# Patient Record
Sex: Female | Born: 1950
Health system: Southern US, Community
[De-identification: ages and names within clinical notes are randomized; demographics above are authoritative.]

## PROBLEM LIST (undated history)

## (undated) DIAGNOSIS — E7800 Pure hypercholesterolemia, unspecified: Secondary | ICD-10-CM

## (undated) HISTORY — DX: Pure hypercholesterolemia, unspecified: E78.00

## (undated) HISTORY — PX: ABDOMINAL HYSTERECTOMY: SHX81

---

## 1999-04-05 ENCOUNTER — Other Ambulatory Visit: Admission: RE | Admit: 1999-04-05 | Discharge: 1999-04-05 | Payer: Self-pay | Admitting: Obstetrics

## 1999-10-02 ENCOUNTER — Encounter: Admission: RE | Admit: 1999-10-02 | Discharge: 1999-10-02 | Payer: Self-pay | Admitting: Obstetrics

## 1999-10-02 ENCOUNTER — Encounter: Payer: Self-pay | Admitting: Obstetrics

## 2000-10-10 ENCOUNTER — Encounter: Admission: RE | Admit: 2000-10-10 | Discharge: 2000-10-10 | Payer: Self-pay | Admitting: Obstetrics

## 2000-10-10 ENCOUNTER — Encounter: Payer: Self-pay | Admitting: Obstetrics

## 2000-12-04 ENCOUNTER — Encounter: Payer: Self-pay | Admitting: Family Medicine

## 2000-12-04 ENCOUNTER — Encounter: Admission: RE | Admit: 2000-12-04 | Discharge: 2000-12-04 | Payer: Self-pay | Admitting: Family Medicine

## 2001-10-12 ENCOUNTER — Encounter: Admission: RE | Admit: 2001-10-12 | Discharge: 2001-10-12 | Payer: Self-pay | Admitting: Obstetrics

## 2001-10-12 ENCOUNTER — Encounter: Payer: Self-pay | Admitting: Obstetrics

## 2001-11-20 ENCOUNTER — Encounter: Admission: RE | Admit: 2001-11-20 | Discharge: 2001-11-20 | Payer: Self-pay | Admitting: Gastroenterology

## 2001-11-20 ENCOUNTER — Encounter: Payer: Self-pay | Admitting: Gastroenterology

## 2002-12-09 ENCOUNTER — Encounter: Payer: Self-pay | Admitting: Gastroenterology

## 2002-12-09 ENCOUNTER — Encounter: Admission: RE | Admit: 2002-12-09 | Discharge: 2002-12-09 | Payer: Self-pay | Admitting: Gastroenterology

## 2004-08-17 ENCOUNTER — Encounter: Admission: RE | Admit: 2004-08-17 | Discharge: 2004-08-17 | Payer: Self-pay | Admitting: Gastroenterology

## 2004-11-20 ENCOUNTER — Other Ambulatory Visit: Admission: RE | Admit: 2004-11-20 | Discharge: 2004-11-20 | Payer: Self-pay | Admitting: Family Medicine

## 2005-02-11 ENCOUNTER — Encounter: Admission: RE | Admit: 2005-02-11 | Discharge: 2005-02-11 | Payer: Self-pay | Admitting: Gastroenterology

## 2005-02-20 ENCOUNTER — Encounter: Admission: RE | Admit: 2005-02-20 | Discharge: 2005-02-20 | Payer: Self-pay | Admitting: Gastroenterology

## 2006-02-21 ENCOUNTER — Encounter: Admission: RE | Admit: 2006-02-21 | Discharge: 2006-02-21 | Payer: Self-pay | Admitting: Family Medicine

## 2006-05-29 ENCOUNTER — Other Ambulatory Visit: Admission: RE | Admit: 2006-05-29 | Discharge: 2006-05-29 | Payer: Self-pay | Admitting: Family Medicine

## 2006-08-06 ENCOUNTER — Encounter: Admission: RE | Admit: 2006-08-06 | Discharge: 2006-08-06 | Payer: Self-pay | Admitting: Gastroenterology

## 2007-06-12 ENCOUNTER — Encounter: Admission: RE | Admit: 2007-06-12 | Discharge: 2007-06-12 | Payer: Self-pay | Admitting: Family Medicine

## 2008-08-02 ENCOUNTER — Encounter: Admission: RE | Admit: 2008-08-02 | Discharge: 2008-08-02 | Payer: Self-pay | Admitting: Family Medicine

## 2009-05-02 ENCOUNTER — Other Ambulatory Visit: Admission: RE | Admit: 2009-05-02 | Discharge: 2009-05-02 | Payer: Self-pay | Admitting: Family Medicine

## 2010-07-15 ENCOUNTER — Encounter: Payer: Self-pay | Admitting: Gastroenterology

## 2010-07-15 ENCOUNTER — Encounter: Payer: Self-pay | Admitting: Family Medicine

## 2013-09-27 ENCOUNTER — Encounter: Payer: Self-pay | Admitting: Neurology

## 2013-09-27 ENCOUNTER — Ambulatory Visit (INDEPENDENT_AMBULATORY_CARE_PROVIDER_SITE_OTHER): Payer: Medicare PPO | Admitting: Neurology

## 2013-09-27 VITALS — BP 138/84 | HR 67 | Ht 60.24 in | Wt 113.3 lb

## 2013-09-27 DIAGNOSIS — G44099 Other trigeminal autonomic cephalgias (TAC), not intractable: Secondary | ICD-10-CM

## 2013-09-27 DIAGNOSIS — R209 Unspecified disturbances of skin sensation: Secondary | ICD-10-CM

## 2013-09-27 DIAGNOSIS — R200 Anesthesia of skin: Secondary | ICD-10-CM
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
## 2013-09-27 NOTE — Patient Instructions (Addendum)
Since the headache is so brief and infrequent, I wouldn't start any medications at this time.  Since the headaches are new and you have numbness on the left side, we will check an MRI of the brain.  I would like you to follow up in 3 months to make sure everything is stable. [facility] [place]  Main entrance  Radiology  10/12/13 10:45 am

## 2013-09-29 ENCOUNTER — Other Ambulatory Visit: Payer: Self-pay | Admitting: Family Medicine

## 2013-09-29 ENCOUNTER — Other Ambulatory Visit (HOSPITAL_COMMUNITY)
Admission: RE | Admit: 2013-09-29 | Discharge: 2013-09-29 | Disposition: A | Discharge status: Home / Self Care | Payer: Medicare PPO | Source: Ambulatory Visit | Attending: Family Medicine | Admitting: Family Medicine

## 2013-09-29 DIAGNOSIS — Z124 Encounter for screening for malignant neoplasm of cervix: Secondary | ICD-10-CM | POA: Insufficient documentation

## 2013-10-12 ENCOUNTER — Other Ambulatory Visit: Payer: Self-pay | Admitting: Neurology

## 2013-10-12 ENCOUNTER — Ambulatory Visit (HOSPITAL_COMMUNITY)
Admission: RE | Admit: 2013-10-12 | Discharge: 2013-10-12 | Disposition: A | Discharge status: Home / Self Care | Payer: Medicare PPO | Source: Ambulatory Visit | Attending: Neurology | Admitting: Neurology

## 2013-10-12 DIAGNOSIS — G44099 Other trigeminal autonomic cephalgias (TAC), not intractable: Secondary | ICD-10-CM

## 2013-10-12 DIAGNOSIS — R200 Anesthesia of skin: Secondary | ICD-10-CM

## 2013-10-12 DIAGNOSIS — R209 Unspecified disturbances of skin sensation: Secondary | ICD-10-CM | POA: Insufficient documentation

## 2013-10-12 MED ORDER — GADOBENATE DIMEGLUMINE 529 MG/ML IV SOLN: 10.0000 mL | Freq: Once | INTRAVENOUS | Status: AC | PRN

## 2013-10-18 ENCOUNTER — Telehealth: Payer: Self-pay | Admitting: *Deleted

## 2013-10-18 NOTE — Telephone Encounter (Signed)
No answer on any telephone number we have will try again this afternoon

## 2013-10-27 ENCOUNTER — Telehealth: Payer: Self-pay | Admitting: Neurology

## 2013-10-27 NOTE — Telephone Encounter (Signed)
Pt called and needs to talk to someone [phone]

## 2013-10-28 NOTE — Telephone Encounter (Signed)
Patient calling to ask if appt is in July  After looking I advised patient that yes it is in July

## 2013-10-28 NOTE — Telephone Encounter (Signed)
Pt friend returning called she said if she does not answer please leave her a message

## 2013-12-27 ENCOUNTER — Ambulatory Visit: Payer: Medicare PPO | Admitting: Neurology

## 2013-12-31 ENCOUNTER — Encounter: Payer: Self-pay | Admitting: Neurology

## 2013-12-31 ENCOUNTER — Ambulatory Visit (INDEPENDENT_AMBULATORY_CARE_PROVIDER_SITE_OTHER): Payer: Medicare PPO | Admitting: Neurology

## 2013-12-31 VITALS — BP 122/64 | HR 68 | Temp 98.0°F | Resp 18 | Ht 61.0 in | Wt 113.3 lb

## 2013-12-31 DIAGNOSIS — G4485 Primary stabbing headache: Secondary | ICD-10-CM

## 2013-12-31 NOTE — Patient Instructions (Signed)
MRI of the brain looked okay.  Since you rarely have these brief headaches, I wouldn't start any medication.  Follow up in 6 months to see how you are doing.  Call with any questions or concerns and I can see you sooner if needed.
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
## 2014-04-05 ENCOUNTER — Other Ambulatory Visit: Payer: Self-pay | Admitting: Family Medicine

## 2014-04-05 DIAGNOSIS — R29898 Other symptoms and signs involving the musculoskeletal system: Secondary | ICD-10-CM

## 2014-04-05 DIAGNOSIS — R200 Anesthesia of skin: Secondary | ICD-10-CM

## 2014-04-11 ENCOUNTER — Emergency Department (HOSPITAL_COMMUNITY)
Admission: EM | Admit: 2014-04-11 | Discharge: 2014-04-11 | Disposition: A | Discharge status: Home / Self Care | Payer: No Typology Code available for payment source | Attending: Emergency Medicine | Admitting: Emergency Medicine

## 2014-04-11 ENCOUNTER — Ambulatory Visit
Admission: RE | Admit: 2014-04-11 | Discharge: 2014-04-11 | Disposition: A | Discharge status: Home / Self Care | Payer: Medicare PPO | Source: Ambulatory Visit | Attending: Family Medicine | Admitting: Family Medicine

## 2014-04-11 ENCOUNTER — Emergency Department (HOSPITAL_COMMUNITY): Payer: No Typology Code available for payment source

## 2014-04-11 ENCOUNTER — Encounter (HOSPITAL_COMMUNITY): Payer: Self-pay | Admitting: Emergency Medicine

## 2014-04-11 DIAGNOSIS — S3992XA Unspecified injury of lower back, initial encounter: Secondary | ICD-10-CM | POA: Insufficient documentation

## 2014-04-11 DIAGNOSIS — M545 Low back pain, unspecified: Secondary | ICD-10-CM

## 2014-04-11 DIAGNOSIS — Z79899 Other long term (current) drug therapy: Secondary | ICD-10-CM | POA: Diagnosis not present

## 2014-04-11 DIAGNOSIS — Z7951 Long term (current) use of inhaled steroids: Secondary | ICD-10-CM | POA: Insufficient documentation

## 2014-04-11 DIAGNOSIS — Y92410 Unspecified street and highway as the place of occurrence of the external cause: Secondary | ICD-10-CM | POA: Insufficient documentation

## 2014-04-11 DIAGNOSIS — S199XXA Unspecified injury of neck, initial encounter: Secondary | ICD-10-CM | POA: Insufficient documentation

## 2014-04-11 DIAGNOSIS — S4991XA Unspecified injury of right shoulder and upper arm, initial encounter: Secondary | ICD-10-CM | POA: Insufficient documentation

## 2014-04-11 DIAGNOSIS — R200 Anesthesia of skin: Secondary | ICD-10-CM

## 2014-04-11 DIAGNOSIS — R29898 Other symptoms and signs involving the musculoskeletal system: Secondary | ICD-10-CM

## 2014-04-11 DIAGNOSIS — Y9389 Activity, other specified: Secondary | ICD-10-CM | POA: Insufficient documentation

## 2014-04-11 DIAGNOSIS — E780 Pure hypercholesterolemia: Secondary | ICD-10-CM | POA: Insufficient documentation

## 2014-04-11 DIAGNOSIS — M25511 Pain in right shoulder: Secondary | ICD-10-CM

## 2014-04-11 MED ORDER — TRAMADOL HCL 50 MG PO TABS
50.0000 mg | ORAL_TABLET | Freq: Once | ORAL | Status: AC
  Administered 2014-04-11: 50 mg via ORAL
  Filled 2014-04-11: qty 1

## 2014-04-11 MED ORDER — TRAMADOL HCL 50 MG PO TABS: 50.0000 mg | ORAL_TABLET | Freq: Four times a day (QID) | ORAL | Status: AC | PRN

## 2014-04-11 MED ORDER — MELOXICAM 7.5 MG PO TABS: 7.5000 mg | ORAL_TABLET | Freq: Every day | ORAL | Status: AC
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
## 2014-04-11 NOTE — ED Notes (Signed)
Bed: WA20 Expected date:  Expected time:  Means of arrival:  Comments: EMS 

## 2014-04-12 NOTE — ED Provider Notes (Signed)
Medical screening examination/treatment/procedure(s) were performed by non-physician practitioner and as supervising physician I was immediately available for consultation/collaboration.   EKG Interpretation None        Jawuan Robb, MD 04/12/14 0105 

## 2014-07-08 ENCOUNTER — Ambulatory Visit: Payer: Medicare PPO | Admitting: Neurology

## 2014-07-14 ENCOUNTER — Ambulatory Visit (INDEPENDENT_AMBULATORY_CARE_PROVIDER_SITE_OTHER): Payer: Medicare PPO | Admitting: Neurology

## 2014-07-14 ENCOUNTER — Encounter: Payer: Self-pay | Admitting: Neurology

## 2014-07-14 VITALS — BP 126/70 | HR 68 | Temp 98.6°F | Resp 16 | Ht 61.0 in | Wt 121.7 lb

## 2014-07-14 DIAGNOSIS — G4485 Primary stabbing headache: Secondary | ICD-10-CM | POA: Diagnosis not present

## 2014-07-14 NOTE — Patient Instructions (Signed)
Overall, you are doing well.  Therefore, I think you can follow up in one year.  Please call with any questions or concerns.
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
## 2014-08-09 DIAGNOSIS — M5412 Radiculopathy, cervical region: Secondary | ICD-10-CM | POA: Diagnosis not present

## 2014-10-04 DIAGNOSIS — H43393 Other vitreous opacities, bilateral: Secondary | ICD-10-CM | POA: Diagnosis not present

## 2014-10-18 DIAGNOSIS — H16223 Keratoconjunctivitis sicca, not specified as Sjogren's, bilateral: Secondary | ICD-10-CM | POA: Diagnosis not present

## 2014-12-27 DIAGNOSIS — H578 Other specified disorders of eye and adnexa: Secondary | ICD-10-CM | POA: Diagnosis not present

## 2015-01-04 DIAGNOSIS — H578 Other specified disorders of eye and adnexa: Secondary | ICD-10-CM | POA: Diagnosis not present

## 2015-01-19 DIAGNOSIS — H40033 Anatomical narrow angle, bilateral: Secondary | ICD-10-CM | POA: Diagnosis not present

## 2015-01-19 DIAGNOSIS — H2513 Age-related nuclear cataract, bilateral: Secondary | ICD-10-CM | POA: Diagnosis not present

## 2015-03-14 DIAGNOSIS — M545 Low back pain: Secondary | ICD-10-CM | POA: Diagnosis not present

## 2015-03-20 DIAGNOSIS — M4696 Unspecified inflammatory spondylopathy, lumbar region: Secondary | ICD-10-CM | POA: Diagnosis not present

## 2015-03-20 DIAGNOSIS — M545 Low back pain: Secondary | ICD-10-CM | POA: Diagnosis not present

## 2015-03-22 DIAGNOSIS — M47816 Spondylosis without myelopathy or radiculopathy, lumbar region: Secondary | ICD-10-CM | POA: Diagnosis not present

## 2015-03-30 DIAGNOSIS — M47816 Spondylosis without myelopathy or radiculopathy, lumbar region: Secondary | ICD-10-CM | POA: Diagnosis not present

## 2015-04-05 DIAGNOSIS — Z7189 Other specified counseling: Secondary | ICD-10-CM | POA: Diagnosis not present

## 2015-04-05 DIAGNOSIS — M549 Dorsalgia, unspecified: Secondary | ICD-10-CM | POA: Diagnosis not present

## 2015-04-05 DIAGNOSIS — Z23 Encounter for immunization: Secondary | ICD-10-CM | POA: Diagnosis not present

## 2015-04-05 DIAGNOSIS — E785 Hyperlipidemia, unspecified: Secondary | ICD-10-CM | POA: Diagnosis not present

## 2015-04-05 DIAGNOSIS — F329 Major depressive disorder, single episode, unspecified: Secondary | ICD-10-CM | POA: Diagnosis not present

## 2015-04-07 DIAGNOSIS — H04123 Dry eye syndrome of bilateral lacrimal glands: Secondary | ICD-10-CM | POA: Diagnosis not present

## 2015-04-19 DIAGNOSIS — M5416 Radiculopathy, lumbar region: Secondary | ICD-10-CM | POA: Diagnosis not present

## 2015-05-23 DIAGNOSIS — M5416 Radiculopathy, lumbar region: Secondary | ICD-10-CM | POA: Diagnosis not present

## 2015-06-02 DIAGNOSIS — M5416 Radiculopathy, lumbar region: Secondary | ICD-10-CM | POA: Diagnosis not present

## 2015-06-05 DIAGNOSIS — H578 Other specified disorders of eye and adnexa: Secondary | ICD-10-CM | POA: Diagnosis not present

## 2015-06-05 DIAGNOSIS — S0500XA Injury of conjunctiva and corneal abrasion without foreign body, unspecified eye, initial encounter: Secondary | ICD-10-CM | POA: Diagnosis not present

## 2015-06-06 DIAGNOSIS — S0500XA Injury of conjunctiva and corneal abrasion without foreign body, unspecified eye, initial encounter: Secondary | ICD-10-CM | POA: Diagnosis not present

## 2015-06-06 DIAGNOSIS — H578 Other specified disorders of eye and adnexa: Secondary | ICD-10-CM | POA: Diagnosis not present

## 2015-06-08 DIAGNOSIS — H04123 Dry eye syndrome of bilateral lacrimal glands: Secondary | ICD-10-CM | POA: Diagnosis not present

## 2015-09-07 DIAGNOSIS — H04123 Dry eye syndrome of bilateral lacrimal glands: Secondary | ICD-10-CM | POA: Diagnosis not present

## 2015-09-13 DIAGNOSIS — M5416 Radiculopathy, lumbar region: Secondary | ICD-10-CM | POA: Diagnosis not present

## 2015-09-26 DIAGNOSIS — M5416 Radiculopathy, lumbar region: Secondary | ICD-10-CM | POA: Diagnosis not present

## 2015-10-16 DIAGNOSIS — Z23 Encounter for immunization: Secondary | ICD-10-CM | POA: Diagnosis not present

## 2015-10-16 DIAGNOSIS — E785 Hyperlipidemia, unspecified: Secondary | ICD-10-CM | POA: Diagnosis not present

## 2015-10-16 DIAGNOSIS — F329 Major depressive disorder, single episode, unspecified: Secondary | ICD-10-CM | POA: Diagnosis not present

## 2016-01-18 DIAGNOSIS — H40033 Anatomical narrow angle, bilateral: Secondary | ICD-10-CM | POA: Diagnosis not present

## 2016-01-18 DIAGNOSIS — H04123 Dry eye syndrome of bilateral lacrimal glands: Secondary | ICD-10-CM | POA: Diagnosis not present

## 2016-01-22 DIAGNOSIS — B001 Herpesviral vesicular dermatitis: Secondary | ICD-10-CM | POA: Diagnosis not present

## 2016-02-08 DIAGNOSIS — H578 Other specified disorders of eye and adnexa: Secondary | ICD-10-CM | POA: Diagnosis not present

## 2016-04-08 DIAGNOSIS — F3342 Major depressive disorder, recurrent, in full remission: Secondary | ICD-10-CM | POA: Diagnosis not present

## 2016-04-15 DIAGNOSIS — M5416 Radiculopathy, lumbar region: Secondary | ICD-10-CM | POA: Diagnosis not present

## 2016-04-16 DIAGNOSIS — M5416 Radiculopathy, lumbar region: Secondary | ICD-10-CM | POA: Diagnosis not present

## 2016-04-17 DIAGNOSIS — F324 Major depressive disorder, single episode, in partial remission: Secondary | ICD-10-CM | POA: Diagnosis not present

## 2016-04-17 DIAGNOSIS — E7800 Pure hypercholesterolemia, unspecified: Secondary | ICD-10-CM | POA: Diagnosis not present

## 2016-04-17 DIAGNOSIS — M549 Dorsalgia, unspecified: Secondary | ICD-10-CM | POA: Diagnosis not present

## 2016-04-17 DIAGNOSIS — Z7189 Other specified counseling: Secondary | ICD-10-CM | POA: Diagnosis not present

## 2016-04-17 DIAGNOSIS — Z23 Encounter for immunization: Secondary | ICD-10-CM | POA: Diagnosis not present

## 2016-04-19 DIAGNOSIS — H04123 Dry eye syndrome of bilateral lacrimal glands: Secondary | ICD-10-CM | POA: Diagnosis not present

## 2016-06-06 DIAGNOSIS — H10023 Other mucopurulent conjunctivitis, bilateral: Secondary | ICD-10-CM | POA: Diagnosis not present

## 2016-07-16 DIAGNOSIS — H04123 Dry eye syndrome of bilateral lacrimal glands: Secondary | ICD-10-CM | POA: Diagnosis not present

## 2016-12-23 DIAGNOSIS — H40033 Anatomical narrow angle, bilateral: Secondary | ICD-10-CM | POA: Diagnosis not present

## 2016-12-23 DIAGNOSIS — H43393 Other vitreous opacities, bilateral: Secondary | ICD-10-CM | POA: Diagnosis not present

## 2017-04-17 DIAGNOSIS — R739 Hyperglycemia, unspecified: Secondary | ICD-10-CM | POA: Diagnosis not present

## 2017-04-17 DIAGNOSIS — B181 Chronic viral hepatitis B without delta-agent: Secondary | ICD-10-CM | POA: Diagnosis not present

## 2017-04-17 DIAGNOSIS — R946 Abnormal results of thyroid function studies: Secondary | ICD-10-CM | POA: Diagnosis not present

## 2017-04-17 DIAGNOSIS — F324 Major depressive disorder, single episode, in partial remission: Secondary | ICD-10-CM | POA: Diagnosis not present

## 2017-04-17 DIAGNOSIS — Z1211 Encounter for screening for malignant neoplasm of colon: Secondary | ICD-10-CM | POA: Diagnosis not present

## 2017-04-17 DIAGNOSIS — M85852 Other specified disorders of bone density and structure, left thigh: Secondary | ICD-10-CM | POA: Diagnosis not present

## 2017-04-17 DIAGNOSIS — Z1231 Encounter for screening mammogram for malignant neoplasm of breast: Secondary | ICD-10-CM | POA: Diagnosis not present

## 2017-04-17 DIAGNOSIS — Z23 Encounter for immunization: Secondary | ICD-10-CM | POA: Diagnosis not present

## 2017-04-17 DIAGNOSIS — M549 Dorsalgia, unspecified: Secondary | ICD-10-CM | POA: Diagnosis not present

## 2017-04-17 DIAGNOSIS — E7800 Pure hypercholesterolemia, unspecified: Secondary | ICD-10-CM | POA: Diagnosis not present

## 2017-04-22 DIAGNOSIS — H04123 Dry eye syndrome of bilateral lacrimal glands: Secondary | ICD-10-CM | POA: Diagnosis not present

## 2017-05-07 DIAGNOSIS — Z86010 Personal history of colonic polyps: Secondary | ICD-10-CM | POA: Diagnosis not present

## 2017-07-03 DIAGNOSIS — Z86010 Personal history of colonic polyps: Secondary | ICD-10-CM | POA: Diagnosis not present

## 2017-07-03 DIAGNOSIS — K640 First degree hemorrhoids: Secondary | ICD-10-CM | POA: Diagnosis not present

## 2017-07-03 DIAGNOSIS — K635 Polyp of colon: Secondary | ICD-10-CM | POA: Diagnosis not present

## 2017-07-08 DIAGNOSIS — K635 Polyp of colon: Secondary | ICD-10-CM | POA: Diagnosis not present

## 2017-07-14 DIAGNOSIS — M8588 Other specified disorders of bone density and structure, other site: Secondary | ICD-10-CM | POA: Diagnosis not present

## 2017-07-18 DIAGNOSIS — H04123 Dry eye syndrome of bilateral lacrimal glands: Secondary | ICD-10-CM | POA: Diagnosis not present

## 2017-10-03 DIAGNOSIS — Z1231 Encounter for screening mammogram for malignant neoplasm of breast: Secondary | ICD-10-CM | POA: Diagnosis not present

## 2017-10-03 DIAGNOSIS — Z803 Family history of malignant neoplasm of breast: Secondary | ICD-10-CM | POA: Diagnosis not present

## 2017-11-28 DIAGNOSIS — H04123 Dry eye syndrome of bilateral lacrimal glands: Secondary | ICD-10-CM | POA: Diagnosis not present

## 2017-12-17 DIAGNOSIS — H40033 Anatomical narrow angle, bilateral: Secondary | ICD-10-CM | POA: Diagnosis not present

## 2017-12-17 DIAGNOSIS — H43393 Other vitreous opacities, bilateral: Secondary | ICD-10-CM | POA: Diagnosis not present

## 2018-01-19 DIAGNOSIS — H04123 Dry eye syndrome of bilateral lacrimal glands: Secondary | ICD-10-CM | POA: Diagnosis not present

## 2018-03-05 DIAGNOSIS — H04123 Dry eye syndrome of bilateral lacrimal glands: Secondary | ICD-10-CM | POA: Diagnosis not present

## 2018-04-22 DIAGNOSIS — H04123 Dry eye syndrome of bilateral lacrimal glands: Secondary | ICD-10-CM | POA: Diagnosis not present

## 2018-04-23 DIAGNOSIS — Z23 Encounter for immunization: Secondary | ICD-10-CM | POA: Diagnosis not present

## 2018-04-23 DIAGNOSIS — Z0000 Encounter for general adult medical examination without abnormal findings: Secondary | ICD-10-CM | POA: Diagnosis not present

## 2018-04-23 DIAGNOSIS — M859 Disorder of bone density and structure, unspecified: Secondary | ICD-10-CM | POA: Diagnosis not present

## 2018-04-23 DIAGNOSIS — F324 Major depressive disorder, single episode, in partial remission: Secondary | ICD-10-CM | POA: Diagnosis not present

## 2018-04-23 DIAGNOSIS — E7800 Pure hypercholesterolemia, unspecified: Secondary | ICD-10-CM | POA: Diagnosis not present

## 2018-04-23 DIAGNOSIS — B181 Chronic viral hepatitis B without delta-agent: Secondary | ICD-10-CM | POA: Diagnosis not present

## 2018-04-27 ENCOUNTER — Other Ambulatory Visit: Payer: Self-pay | Admitting: Family Medicine

## 2018-04-27 DIAGNOSIS — B181 Chronic viral hepatitis B without delta-agent: Secondary | ICD-10-CM

## 2018-04-28 DIAGNOSIS — H04123 Dry eye syndrome of bilateral lacrimal glands: Secondary | ICD-10-CM | POA: Diagnosis not present

## 2018-05-01 ENCOUNTER — Ambulatory Visit
Admission: RE | Admit: 2018-05-01 | Discharge: 2018-05-01 | Disposition: A | Discharge status: Home / Self Care | Payer: Medicare PPO | Source: Ambulatory Visit | Attending: Family Medicine | Admitting: Family Medicine

## 2018-05-01 DIAGNOSIS — B181 Chronic viral hepatitis B without delta-agent: Secondary | ICD-10-CM

## 2018-07-23 DIAGNOSIS — H16223 Keratoconjunctivitis sicca, not specified as Sjogren's, bilateral: Secondary | ICD-10-CM | POA: Diagnosis not present

## 2018-09-10 DIAGNOSIS — H04123 Dry eye syndrome of bilateral lacrimal glands: Secondary | ICD-10-CM | POA: Diagnosis not present

## 2018-12-17 DIAGNOSIS — H04123 Dry eye syndrome of bilateral lacrimal glands: Secondary | ICD-10-CM | POA: Diagnosis not present

## 2019-02-12 ENCOUNTER — Emergency Department (HOSPITAL_COMMUNITY): Payer: Medicare PPO

## 2019-02-12 ENCOUNTER — Emergency Department (HOSPITAL_COMMUNITY)
Admission: EM | Admit: 2019-02-12 | Discharge: 2019-02-13 | Disposition: A | Discharge status: Home / Self Care | Payer: Medicare PPO | Attending: Emergency Medicine | Admitting: Emergency Medicine

## 2019-02-12 DIAGNOSIS — S53105A Unspecified dislocation of left ulnohumeral joint, initial encounter: Secondary | ICD-10-CM | POA: Insufficient documentation

## 2019-02-12 DIAGNOSIS — R52 Pain, unspecified: Secondary | ICD-10-CM

## 2019-02-12 DIAGNOSIS — Z79899 Other long term (current) drug therapy: Secondary | ICD-10-CM | POA: Diagnosis not present

## 2019-02-12 DIAGNOSIS — M25512 Pain in left shoulder: Secondary | ICD-10-CM | POA: Diagnosis not present

## 2019-02-12 DIAGNOSIS — S53125A Posterior dislocation of left ulnohumeral joint, initial encounter: Secondary | ICD-10-CM | POA: Diagnosis not present

## 2019-02-12 DIAGNOSIS — Y999 Unspecified external cause status: Secondary | ICD-10-CM | POA: Insufficient documentation

## 2019-02-12 DIAGNOSIS — Y929 Unspecified place or not applicable: Secondary | ICD-10-CM | POA: Insufficient documentation

## 2019-02-12 DIAGNOSIS — W1830XA Fall on same level, unspecified, initial encounter: Secondary | ICD-10-CM | POA: Insufficient documentation

## 2019-02-12 DIAGNOSIS — S59902A Unspecified injury of left elbow, initial encounter: Secondary | ICD-10-CM | POA: Diagnosis present

## 2019-02-12 DIAGNOSIS — S53102A Unspecified subluxation of left ulnohumeral joint, initial encounter: Secondary | ICD-10-CM | POA: Diagnosis not present

## 2019-02-12 DIAGNOSIS — Y93K1 Activity, walking an animal: Secondary | ICD-10-CM | POA: Insufficient documentation

## 2019-02-12 MED ORDER — FENTANYL CITRATE (PF) 100 MCG/2ML IJ SOLN
25.0000 ug | Freq: Once | INTRAMUSCULAR | Status: AC
Start: 2019-02-12 — End: 2019-02-12
  Administered 2019-02-12: 25 ug via NASAL
  Filled 2019-02-12: qty 2

## 2019-02-12 MED ORDER — PROPOFOL 10 MG/ML IV BOLUS
0.5000 mg/kg | Freq: Once | INTRAVENOUS | Status: DC
  Filled 2019-02-12: qty 20

## 2019-02-12 MED ORDER — MORPHINE SULFATE (PF) 4 MG/ML IV SOLN
4.0000 mg | Freq: Once | INTRAVENOUS | Status: AC
  Administered 2019-02-13: 4 mg via INTRAVENOUS
  Filled 2019-02-12: qty 1

## 2019-02-12 MED ORDER — ONDANSETRON HCL 4 MG/2ML IJ SOLN
4.0000 mg | Freq: Once | INTRAMUSCULAR | Status: AC
  Administered 2019-02-13: 4 mg via INTRAVENOUS
  Filled 2019-02-12: qty 2

## 2019-02-12 NOTE — ED Provider Notes (Signed)
MSE was initiated and I personally evaluated the patient and placed orders (if any) at  8:46 PM on February 12, 2019.  The patient appears stable so that the remainder of the MSE may be completed by another provider.   68 year old female brought in by her neighbor for left arm injury.  Patient was pulled down by her dog on a leash and has pain in her left elbow and left shoulder area.  Patient did not hit her head, no loss of consciousness.  No neck pain.  Patient has movement sensation intact in her left hand, strong radial pulse.  Pain in the left elbow and left shoulder, no pain along clavicle or C-spine.   [name], PA-C 02/12/19 2047    [name], MD 02/13/19 [phone]

## 2019-02-12 NOTE — ED Provider Notes (Signed)
[facility]   CSN: [id number] Arrival date & time: 02/12/19  2010     History   Chief Complaint Chief Complaint  Patient presents with  . Arm Injury    HPI Kayla Duffy is a 68 y.o. female.     68 year old female presents to the emergency department for evaluation of pain to her left elbow and shoulder.  She was walking the dog when the dog saw a rabbit and pulled on the leash causing her to fall.  Pain is been constant since onset.  No medications taken prior to arrival.  She denies any numbness or tingling in her left upper extremity, neck pain, weakness.  She did not hit her head or lose consciousness.  Not on chronic anticoagulation.  Last ate/drank at 1830.  The history is provided by the patient. No language interpreter was used.  Arm Injury   Past Medical History:  Diagnosis Date  . High cholesterol     There are no active problems to display for this patient.   Past Surgical History:  Procedure Laterality Date  . ABDOMINAL HYSTERECTOMY       OB History   No obstetric history on file.      Home Medications    Prior to Admission medications   Medication Sig Start Date End Date Taking? Authorizing Provider  atorvastatin (LIPITOR) 20 MG tablet Take 20 mg by mouth daily.    [provider]  cyclobenzaprine (FLEXERIL) 10 MG tablet  07/07/14   [provider]  HYDROcodone-acetaminophen (NORCO/VICODIN) 5-325 MG tablet Take 1 tablet by mouth every 4 (four) hours as needed for severe pain. 02/13/19   Antony MaduraHumes, Jaemarie Hochberg, PA-C  ketorolac (ACULAR) 0.5 % ophthalmic solution Place 1 drop into the right eye 4 (four) times daily.    [provider]  meloxicam (MOBIC) 7.5 MG tablet Take 1 tablet (7.5 mg total) by mouth daily. 04/11/14   Lurene ShadowPhelps, Erin O, PA-C  Multiple Vitamin (MULTIVITAMIN WITH MINERALS) TABS tablet Take 2 tablets by mouth daily.    [provider]  prednisoLONE sodium  phosphate (INFLAMASE FORTE) 1 % ophthalmic solution Place 1 drop into the right eye 4 (four) times daily.    [provider]  traMADol (ULTRAM) 50 MG tablet Take 1 tablet (50 mg total) by mouth every 6 (six) hours as needed. 04/11/14   Lurene ShadowPhelps, Erin O, PA-C  venlafaxine (EFFEXOR) 75 MG tablet Take 37.5 mg by mouth 2 (two) times daily.     [provider]    Family History Family History  Problem Relation Age of Onset  . Cancer Mother        cervical    Social History Social History   Tobacco Use  . Smoking status: Never Smoker  . Smokeless tobacco: Never Used  Substance Use Topics  . Alcohol use: No  . Drug use: Not on file     Allergies   Patient has no known allergies.   Review of Systems Review of Systems Ten systems reviewed and are negative for acute change, except as noted in the HPI.    Physical Exam Updated Vital Signs BP (!) 155/99 (BP Location: Right Arm)   Pulse 90   Temp 97.8 F (36.6 C) (Oral)   Resp 18   SpO2 98%   Physical Exam Vitals signs and nursing note reviewed.  Constitutional:      General: She is not in acute distress.    Appearance: She is  well-developed. She is not diaphoretic.     Comments: Nontoxic appearing.  HENT:     Head: Normocephalic and atraumatic.  Eyes:     General: No scleral icterus.    Conjunctiva/sclera: Conjunctivae normal.  Neck:     Musculoskeletal: Normal range of motion.  Cardiovascular:     Rate and Rhythm: Normal rate and regular rhythm.     Pulses: Normal pulses.     Comments: Distal radial pulse 2+ in the LUE Pulmonary:     Effort: Pulmonary effort is normal. No respiratory distress.     Comments: Respirations even and unlabored Musculoskeletal:     Comments: No deformity or crepitus to the L shoulder; preserved ROM. Left elbow in splint.  Skin:    General: Skin is warm and dry.     Coloration: Skin is not pale.     Findings: No erythema or rash.  Neurological:     Mental Status: She  is alert and oriented to person, place, and time.     Comments: Sensation to light touch intact in the LUE. Grip strength 5/5 in the left hand.  Psychiatric:        Behavior: Behavior normal.      ED Treatments / Results  Labs (all labs ordered are listed, but only abnormal results are displayed) Labs Reviewed - No data to display  EKG None  Radiology Dg Elbow 2 Views Left  Result Date: 02/13/2019 CLINICAL DATA:  Dislocation, postreduction. EXAM: LEFT ELBOW - 2 VIEW COMPARISON:  Pre reduction radiographs yesterday. FINDINGS: Previous elbow dislocation has been reduced. The alignment appears anatomic. Small fracture fragment adjacent to the medial humeral epicondyle, donor site uncertain. Small joint effusion. Diffuse soft tissue edema. IMPRESSION: Reduction of prior elbow dislocation. Small fracture fragment adjacent to the medial humeral epicondyle, donor site uncertain. Electronically Signed   By: Keith Rake M.D.   On: 02/13/2019 01:25   Dg Elbow Complete Left  Result Date: 02/12/2019 CLINICAL DATA:  Fall, pain EXAM: LEFT ELBOW - COMPLETE 3+ VIEW COMPARISON:  None. FINDINGS: There is posterior dislocation of the left elbow. No evident fracture. Soft tissue edema. IMPRESSION: There is posterior dislocation of the left elbow. No evident fracture. Soft tissue edema. Electronically Signed   By: Eddie Candle M.D.   On: 02/12/2019 21:48   Dg Shoulder Left  Result Date: 02/12/2019 CLINICAL DATA:  Pulled over by dog on leash.  Left shoulder pain. EXAM: LEFT SHOULDER - 2+ VIEW COMPARISON:  None. FINDINGS: There is no evidence of fracture or dislocation. There is no evidence of arthropathy or other focal bone abnormality. Soft tissues are unremarkable. IMPRESSION: Negative. Electronically Signed   By: Misty Stanley M.D.   On: 02/12/2019 21:45    Procedures Reduction of dislocation  Date/Time: 02/13/2019 1:08 AM Performed by: Antonietta Breach, PA-C Authorized by: Merrily Pew, MD   Consent: The procedure was performed in an emergent situation. Verbal consent obtained. Written consent obtained. Risks and benefits: risks, benefits and alternatives were discussed Consent given by: patient Patient understanding: patient states understanding of the procedure being performed Patient consent: the patient's understanding of the procedure matches consent given Procedure consent: procedure consent matches procedure scheduled Relevant documents: relevant documents present and verified Test results: test results available and properly labeled Site marked: the operative site was marked Imaging studies: imaging studies available Required items: required blood products, implants, devices, and special equipment available Patient identity confirmed: verbally with patient and arm band Time out: Immediately prior to procedure a "  time out" was called to verify the correct patient, procedure, equipment, support staff and site/side marked as required. Preparation: Patient was prepped and draped in the usual sterile fashion. Local anesthesia used: no  Anesthesia: Local anesthesia used: no  Sedation: Patient sedated: yes Sedatives: propofol (see separate sedation procedure note) Vitals: Vital signs were monitored during sedation.  Patient tolerance: patient tolerated the procedure well with no immediate complications Comments: Successful reduction of left elbow dislocation.  Posterior long-arm splint applied following reduction.  Patient tolerated well without complications.    (including critical care time)  Medications Ordered in ED Medications  propofol (DIPRIVAN) 10 mg/mL bolus/IV push 27.5-55 mg (has no administration in time range)  ketorolac (TORADOL) 30 MG/ML injection 15 mg (has no administration in time range)  fentaNYL (SUBLIMAZE) injection 25 mcg (25 mcg Nasal Given 02/12/19 2046)  morphine 4 MG/ML injection 4 mg (4 mg Intravenous Given 02/13/19 0001)  ondansetron  (ZOFRAN) injection 4 mg (4 mg Intravenous Given 02/13/19 0001)  0.9 %  sodium chloride infusion ( Intravenous Stopped 02/13/19 0156)  propofol (DIPRIVAN) 10 mg/mL bolus/IV push (30 mg Intravenous Given 02/13/19 0046)  HYDROcodone-acetaminophen (NORCO/VICODIN) 5-325 MG per tablet 1 tablet (1 tablet Oral Given 02/13/19 0150)    1:45 AM Patient reassessed. Good distal radial pulse post splinting.   Initial Impression / Assessment and Plan / ED Course  I have reviewed the triage vital signs and the nursing notes.  Pertinent labs & imaging results that were available during my care of the patient were reviewed by me and considered in my medical decision making (see chart for details).        68 year old female presents to the emergency department for evaluation of pain to her left upper extremity after her arm was pulled while walking her dog.  Noted to have an left elbow dislocation.  This was reduced successfully under sedation with propofol.  Patient tolerated well without complications.  Neurovascularly intact pre-and post reduction of dislocation.  Pain controlled with morphine as well as 1 tablet of oral Vicodin.  Placed in a posterior long-arm splint and will be given referral to orthopedics to ensure proper healing.  Return precautions discussed and provided. Patient discharged in stable condition with no unaddressed concerns.   Final Clinical Impressions(s) / ED Diagnoses   Final diagnoses:  Elbow dislocation, left, initial encounter    ED Discharge Orders         Ordered    HYDROcodone-acetaminophen (NORCO/VICODIN) 5-325 MG tablet  Every 4 hours PRN     02/13/19 0139           Antony MaduraHumes, Kalecia Hartney, Cordelia Poche-C 02/13/19 0202    Mesner, Barbara CowerJason, MD 02/13/19 [phone]

## 2019-02-12 NOTE — ED Notes (Signed)
Patient transported to X-ray 

## 2019-02-12 NOTE — ED Notes (Signed)
Left arm immobilized with brace

## 2019-02-12 NOTE — ED Triage Notes (Signed)
Pt here with neighbor. Per neighbor, pt fell after being pulled down by dog on a leash. Pt fell on left arm. C/o pain at elbow. Pt is alert and oriented. Denies LOC.

## 2019-02-13 ENCOUNTER — Emergency Department (HOSPITAL_COMMUNITY): Payer: Medicare PPO

## 2019-02-13 DIAGNOSIS — S53105A Unspecified dislocation of left ulnohumeral joint, initial encounter: Secondary | ICD-10-CM | POA: Diagnosis not present

## 2019-02-13 MED ORDER — PROPOFOL 10 MG/ML IV BOLUS
INTRAVENOUS | Status: AC | PRN
  Administered 2019-02-13: 30 mg via INTRAVENOUS

## 2019-02-13 MED ORDER — HYDROCODONE-ACETAMINOPHEN 5-325 MG PO TABS: 1.0000 | ORAL_TABLET | ORAL | 0 refills | Status: AC | PRN

## 2019-02-13 MED ORDER — HYDROCODONE-ACETAMINOPHEN 5-325 MG PO TABS
1.0000 | ORAL_TABLET | Freq: Once | ORAL | Status: AC
  Administered 2019-02-13: 02:00:00 1 via ORAL
  Filled 2019-02-13: qty 1

## 2019-02-13 MED ORDER — SODIUM CHLORIDE 0.9 % IV SOLN
INTRAVENOUS | Status: AC | PRN
  Administered 2019-02-13: 1000 mL via INTRAVENOUS

## 2019-02-13 MED ORDER — KETOROLAC TROMETHAMINE 30 MG/ML IJ SOLN: 15.0000 mg | Freq: Once | INTRAMUSCULAR | Status: DC

## 2019-02-13 NOTE — Discharge Instructions (Signed)
Call the office of Dr. Griffin Basil in the morning to schedule close outpatient follow-up to ensure proper healing of your elbow dislocation.  Keep your splint on at all times.  Do not remove it.  Keep it dry and clean as best as you are able.  We recommend use of ibuprofen or Aleve for pain.  You may also take Norco as prescribed for severe pain.  Ice over top of your splint 3-4 times per day to assist in improving inflammation.  Keep your arm elevated to prevent swelling in your fingers.  Return to the emergency department for any new or concerning symptoms - especially if your hand becomes numb or cold, persistently pale.

## 2019-02-13 NOTE — ED Provider Notes (Signed)
Medical screening examination/treatment/procedure(s) were conducted as a shared visit with non-physician practitioner(s) and myself.  I personally evaluated the patient during the encounter.  Physical Exam  BP (!) 165/102 (BP Location: Right Arm)   Pulse 92   Temp 98.4 F (36.9 C) (Oral)   Resp (!) 22   SpO2 96%   Physical Exam Vitals signs and nursing note reviewed.  Constitutional:      Appearance: Normal appearance.  HENT:     Mouth/Throat:     Mouth: Mucous membranes are moist.  Eyes:     Pupils: Pupils are equal, round, and reactive to light.  Neck:     Musculoskeletal: Normal range of motion. No neck rigidity or muscular tenderness.  Cardiovascular:     Rate and Rhythm: Normal rate.  Pulmonary:     Breath sounds: No wheezing.  Abdominal:     General: There is no distension.     Tenderness: There is no abdominal tenderness.  Musculoskeletal:     Comments: Left arm with ecchymosis in elbow, deformity, tenderness in same.   Lymphadenopathy:     Cervical: No cervical adenopathy.  Neurological:     Mental Status: She is alert.     ED Course/Procedures     .Sedation  Date/Time: 02/13/2019 12:51 AM Performed by: Merrily Pew, MD Authorized by: Merrily Pew, MD   Consent:    Consent obtained:  Verbal   Consent given by:  Patient   Risks discussed:  Allergic reaction, dysrhythmia, inadequate sedation, nausea, prolonged hypoxia resulting in organ damage, prolonged sedation necessitating reversal, respiratory compromise necessitating ventilatory assistance and intubation and vomiting   Alternatives discussed:  Analgesia without sedation, anxiolysis and regional anesthesia Universal protocol:    Procedure explained and questions answered to patient or proxy's satisfaction: yes     Relevant documents present and verified: yes     Test results available and properly labeled: yes     Imaging studies available: yes     Required blood products, implants, devices, and  special equipment available: yes     Site/side marked: yes     Immediately prior to procedure a time out was called: yes     Patient identity confirmation method:  Verbally with patient Indications:    Procedure necessitating sedation performed by:  Physician performing sedation Pre-sedation assessment:    Time since last food or drink:  Few hours   ASA classification: class 1 - normal, healthy patient     Neck mobility: normal     Mouth opening:  3 or more finger widths   Thyromental distance:  4 finger widths   Mallampati score:  I - soft palate, uvula, fauces, pillars visible   Pre-sedation assessments completed and reviewed: airway patency, cardiovascular function, hydration status, mental status, nausea/vomiting, pain level, respiratory function and temperature     Pre-sedation assessment completed:  02/13/2019 12:42 AM Immediate pre-procedure details:    Reassessment: Patient reassessed immediately prior to procedure     Reviewed: vital signs, relevant labs/tests and NPO status     Verified: bag valve mask available, emergency equipment available, intubation equipment available, IV patency confirmed, oxygen available and suction available   Procedure details (see MAR for exact dosages):    Preoxygenation:  Nasal cannula   Sedation:  Propofol   Intra-procedure monitoring:  Blood pressure monitoring, cardiac monitor, continuous pulse oximetry, frequent LOC assessments, frequent vital sign checks and continuous capnometry   Intra-procedure events: none     Total Provider sedation time (minutes):  16  Post-procedure details:    Attendance: Constant attendance by certified staff until patient recovered     Recovery: Patient returned to pre-procedure baseline     Post-sedation assessments completed and reviewed: airway patency, cardiovascular function, hydration status, mental status, nausea/vomiting, pain level, respiratory function and temperature     Patient is stable for discharge or  admission: yes     Patient tolerance:  Tolerated well, no immediate complications    MDM   Patient walking dog and it took off pulling her left arm straight out with it. Has pain in left elbow since that time. No other injuries.   Sedation by myself, reduction by PA Humes.   Reduction films at bedside appear to be reduced. Plan for splint, post reduction films, ortho follow up.         Marily MemosMesner, Yusuke Beza, MD 02/13/19 [phone]

## 2019-02-16 DIAGNOSIS — S53105A Unspecified dislocation of left ulnohumeral joint, initial encounter: Secondary | ICD-10-CM | POA: Diagnosis not present

## 2019-02-26 DIAGNOSIS — S53105D Unspecified dislocation of left ulnohumeral joint, subsequent encounter: Secondary | ICD-10-CM | POA: Diagnosis not present

## 2019-03-04 DIAGNOSIS — M25522 Pain in left elbow: Secondary | ICD-10-CM | POA: Diagnosis not present

## 2019-03-04 DIAGNOSIS — M25622 Stiffness of left elbow, not elsewhere classified: Secondary | ICD-10-CM | POA: Diagnosis not present

## 2019-03-04 DIAGNOSIS — S53105D Unspecified dislocation of left ulnohumeral joint, subsequent encounter: Secondary | ICD-10-CM | POA: Diagnosis not present

## 2019-03-04 DIAGNOSIS — M6281 Muscle weakness (generalized): Secondary | ICD-10-CM | POA: Diagnosis not present

## 2019-03-09 DIAGNOSIS — M25622 Stiffness of left elbow, not elsewhere classified: Secondary | ICD-10-CM | POA: Diagnosis not present

## 2019-03-09 DIAGNOSIS — S53105D Unspecified dislocation of left ulnohumeral joint, subsequent encounter: Secondary | ICD-10-CM | POA: Diagnosis not present

## 2019-03-09 DIAGNOSIS — M25522 Pain in left elbow: Secondary | ICD-10-CM | POA: Diagnosis not present

## 2019-03-09 DIAGNOSIS — M6281 Muscle weakness (generalized): Secondary | ICD-10-CM | POA: Diagnosis not present

## 2019-03-11 DIAGNOSIS — S53105D Unspecified dislocation of left ulnohumeral joint, subsequent encounter: Secondary | ICD-10-CM | POA: Diagnosis not present

## 2019-03-11 DIAGNOSIS — M25522 Pain in left elbow: Secondary | ICD-10-CM | POA: Diagnosis not present

## 2019-03-11 DIAGNOSIS — M6281 Muscle weakness (generalized): Secondary | ICD-10-CM | POA: Diagnosis not present

## 2019-03-11 DIAGNOSIS — M25622 Stiffness of left elbow, not elsewhere classified: Secondary | ICD-10-CM | POA: Diagnosis not present

## 2019-03-16 DIAGNOSIS — M25522 Pain in left elbow: Secondary | ICD-10-CM | POA: Diagnosis not present

## 2019-03-16 DIAGNOSIS — M6281 Muscle weakness (generalized): Secondary | ICD-10-CM | POA: Diagnosis not present

## 2019-03-16 DIAGNOSIS — S53105D Unspecified dislocation of left ulnohumeral joint, subsequent encounter: Secondary | ICD-10-CM | POA: Diagnosis not present

## 2019-03-16 DIAGNOSIS — M25622 Stiffness of left elbow, not elsewhere classified: Secondary | ICD-10-CM | POA: Diagnosis not present

## 2019-03-18 DIAGNOSIS — S53105A Unspecified dislocation of left ulnohumeral joint, initial encounter: Secondary | ICD-10-CM | POA: Diagnosis not present

## 2019-05-06 DIAGNOSIS — Z0000 Encounter for general adult medical examination without abnormal findings: Secondary | ICD-10-CM | POA: Diagnosis not present

## 2019-05-06 DIAGNOSIS — M85852 Other specified disorders of bone density and structure, left thigh: Secondary | ICD-10-CM | POA: Diagnosis not present

## 2019-05-06 DIAGNOSIS — Z1231 Encounter for screening mammogram for malignant neoplasm of breast: Secondary | ICD-10-CM | POA: Diagnosis not present

## 2019-05-06 DIAGNOSIS — B181 Chronic viral hepatitis B without delta-agent: Secondary | ICD-10-CM | POA: Diagnosis not present

## 2019-05-06 DIAGNOSIS — Z23 Encounter for immunization: Secondary | ICD-10-CM | POA: Diagnosis not present

## 2019-05-06 DIAGNOSIS — E7800 Pure hypercholesterolemia, unspecified: Secondary | ICD-10-CM | POA: Diagnosis not present

## 2019-05-06 DIAGNOSIS — F324 Major depressive disorder, single episode, in partial remission: Secondary | ICD-10-CM | POA: Diagnosis not present

## 2019-05-25 ENCOUNTER — Other Ambulatory Visit: Payer: Self-pay | Admitting: Family Medicine

## 2019-05-25 DIAGNOSIS — B181 Chronic viral hepatitis B without delta-agent: Secondary | ICD-10-CM

## 2019-06-01 DIAGNOSIS — M8589 Other specified disorders of bone density and structure, multiple sites: Secondary | ICD-10-CM | POA: Diagnosis not present

## 2019-06-01 DIAGNOSIS — R29890 Loss of height: Secondary | ICD-10-CM | POA: Diagnosis not present

## 2019-06-01 DIAGNOSIS — Z1231 Encounter for screening mammogram for malignant neoplasm of breast: Secondary | ICD-10-CM | POA: Diagnosis not present

## 2019-06-01 DIAGNOSIS — N951 Menopausal and female climacteric states: Secondary | ICD-10-CM | POA: Diagnosis not present

## 2019-06-02 ENCOUNTER — Ambulatory Visit
Admission: RE | Admit: 2019-06-02 | Discharge: 2019-06-02 | Disposition: A | Discharge status: Home / Self Care | Payer: Medicare PPO | Source: Ambulatory Visit | Attending: Family Medicine | Admitting: Family Medicine

## 2019-06-02 DIAGNOSIS — B181 Chronic viral hepatitis B without delta-agent: Secondary | ICD-10-CM | POA: Diagnosis not present

## 2019-07-26 ENCOUNTER — Other Ambulatory Visit: Payer: Medicare PPO

## 2019-10-19 DIAGNOSIS — H04123 Dry eye syndrome of bilateral lacrimal glands: Secondary | ICD-10-CM | POA: Diagnosis not present

## 2020-04-21 DIAGNOSIS — R109 Unspecified abdominal pain: Secondary | ICD-10-CM | POA: Diagnosis not present

## 2020-04-21 DIAGNOSIS — Z23 Encounter for immunization: Secondary | ICD-10-CM | POA: Diagnosis not present

## 2020-05-08 DIAGNOSIS — Z131 Encounter for screening for diabetes mellitus: Secondary | ICD-10-CM | POA: Diagnosis not present

## 2020-05-08 DIAGNOSIS — Z23 Encounter for immunization: Secondary | ICD-10-CM | POA: Diagnosis not present

## 2020-05-08 DIAGNOSIS — F324 Major depressive disorder, single episode, in partial remission: Secondary | ICD-10-CM | POA: Diagnosis not present

## 2020-05-08 DIAGNOSIS — M549 Dorsalgia, unspecified: Secondary | ICD-10-CM | POA: Diagnosis not present

## 2020-05-08 DIAGNOSIS — Z0000 Encounter for general adult medical examination without abnormal findings: Secondary | ICD-10-CM | POA: Diagnosis not present

## 2020-05-08 DIAGNOSIS — E7800 Pure hypercholesterolemia, unspecified: Secondary | ICD-10-CM | POA: Diagnosis not present

## 2020-05-08 DIAGNOSIS — B181 Chronic viral hepatitis B without delta-agent: Secondary | ICD-10-CM | POA: Diagnosis not present

## 2020-05-08 DIAGNOSIS — R739 Hyperglycemia, unspecified: Secondary | ICD-10-CM | POA: Diagnosis not present

## 2020-05-09 ENCOUNTER — Other Ambulatory Visit: Payer: Self-pay | Admitting: Family Medicine

## 2020-05-09 DIAGNOSIS — B181 Chronic viral hepatitis B without delta-agent: Secondary | ICD-10-CM

## 2020-05-25 ENCOUNTER — Ambulatory Visit
Admission: RE | Admit: 2020-05-25 | Discharge: 2020-05-25 | Disposition: A | Discharge status: Home / Self Care | Payer: Medicare PPO | Source: Ambulatory Visit | Attending: Family Medicine | Admitting: Family Medicine

## 2020-05-25 DIAGNOSIS — B181 Chronic viral hepatitis B without delta-agent: Secondary | ICD-10-CM

## 2020-05-25 DIAGNOSIS — K7689 Other specified diseases of liver: Secondary | ICD-10-CM | POA: Diagnosis not present

## 2020-06-07 DIAGNOSIS — H04123 Dry eye syndrome of bilateral lacrimal glands: Secondary | ICD-10-CM | POA: Diagnosis not present

## 2020-06-14 DIAGNOSIS — Z1231 Encounter for screening mammogram for malignant neoplasm of breast: Secondary | ICD-10-CM | POA: Diagnosis not present

## 2020-08-02 IMAGING — CR LEFT ELBOW - COMPLETE 3+ VIEW
4 series · 4 of 4 positions shown · non-contrast
Comparison: None.

CLINICAL DATA: Fall, pain

EXAM:
LEFT ELBOW - COMPLETE 3+ VIEW

[elbow ap]
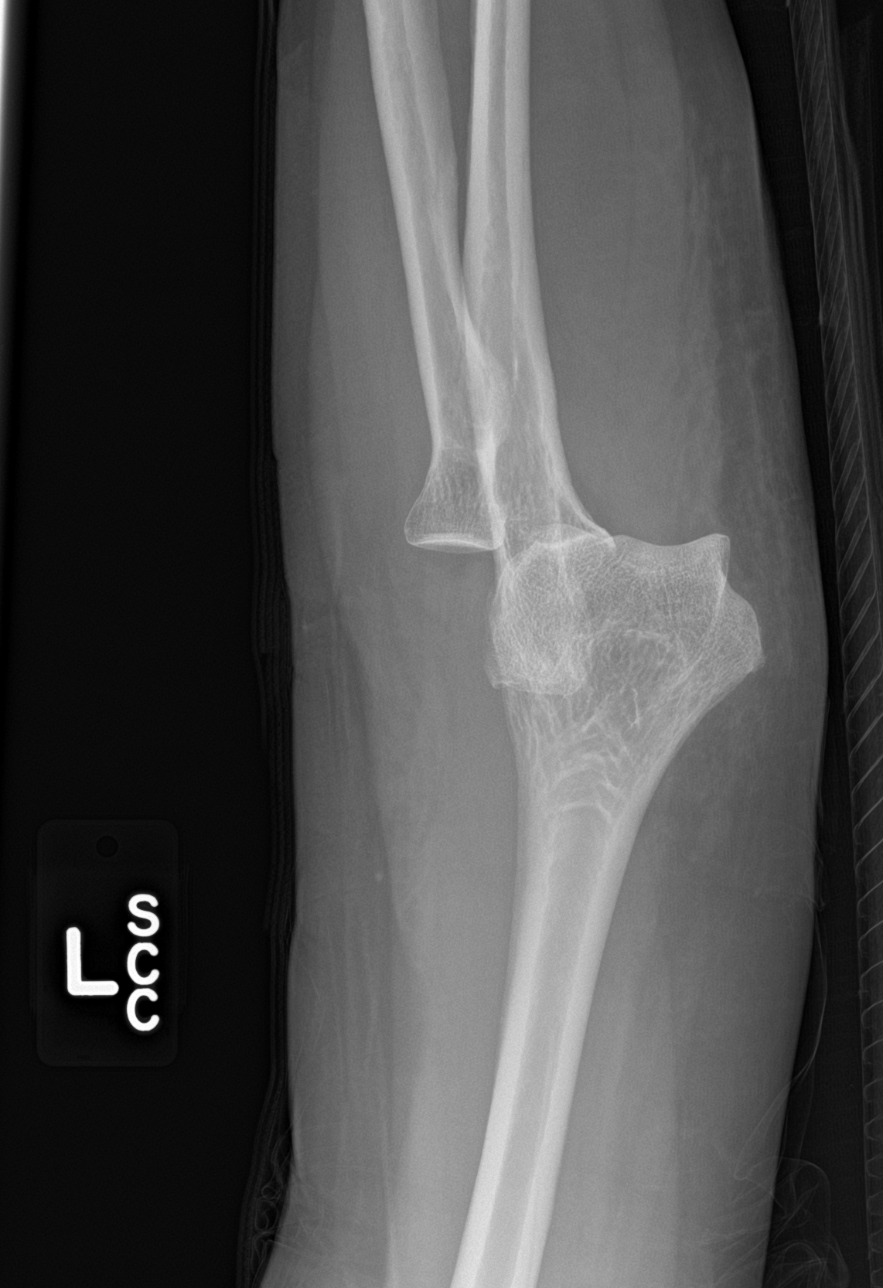

[elbow obl (1 of 2)]
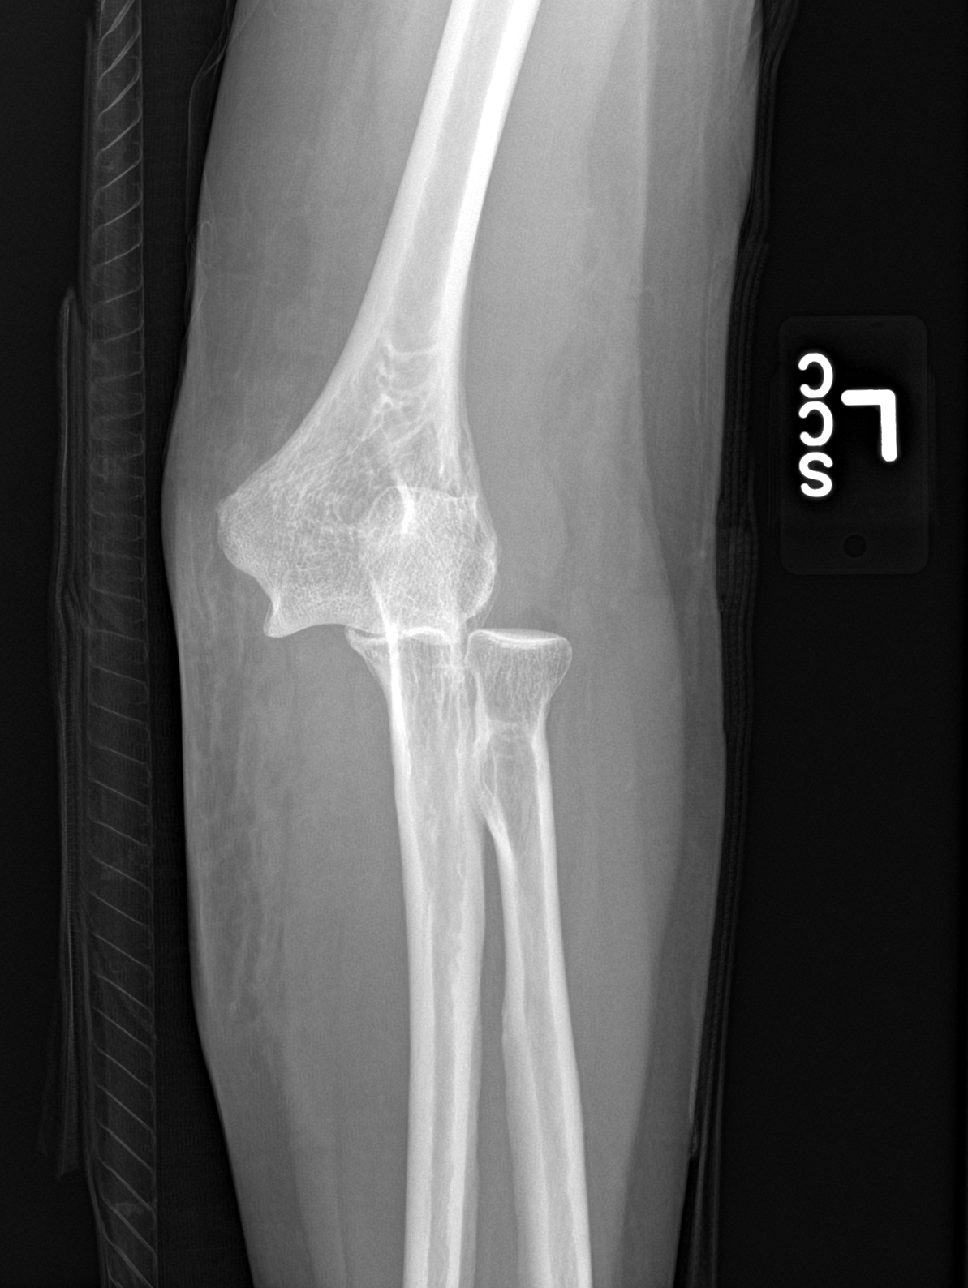

[elbow obl (2 of 2)]
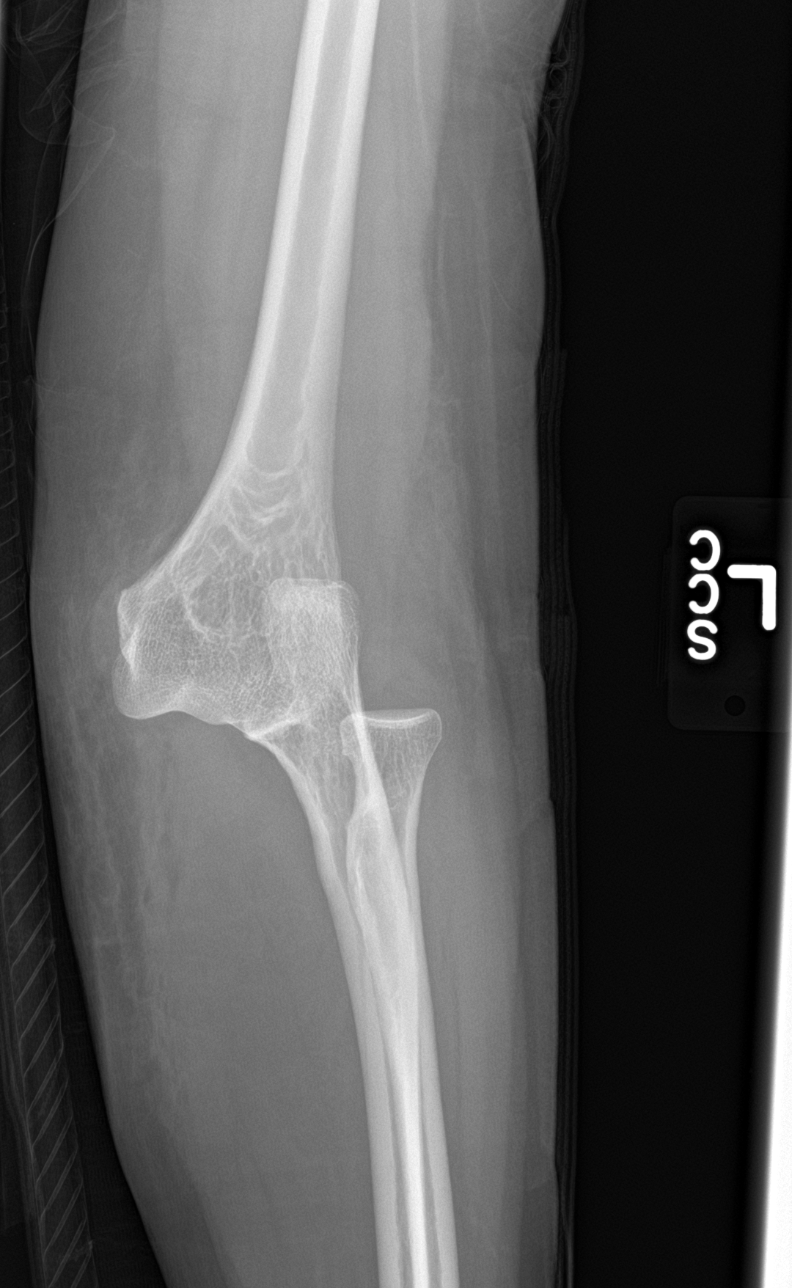

[elbow lat]
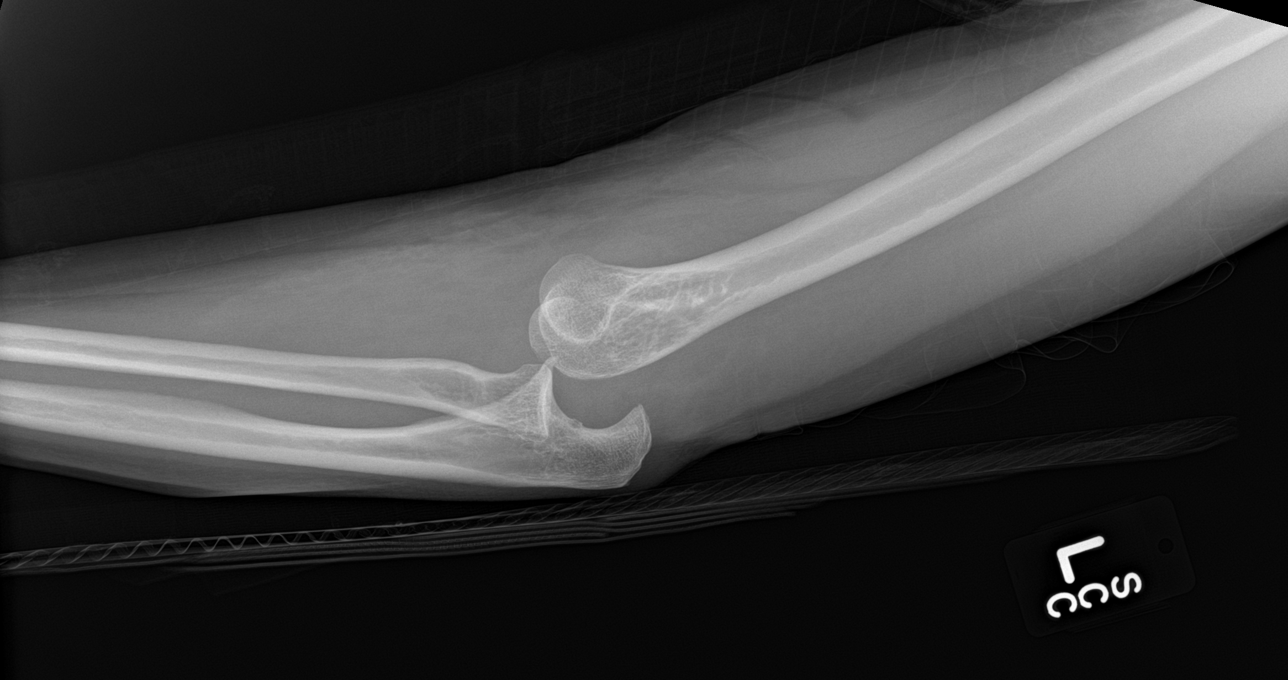

[4 of 4 positions shown; findings below may reference images not displayed]

FINDINGS: There is posterior dislocation of the left elbow. No evident
fracture. Soft tissue edema.
IMPRESSION: There is posterior dislocation of the left elbow. No evident
fracture. Soft tissue edema.

## 2020-08-02 IMAGING — CR LEFT SHOULDER - 2+ VIEW
2 series · 2 of 2 positions shown · non-contrast
Comparison: None.

CLINICAL DATA: Pulled over by dog on leash.  Left shoulder pain.

EXAM:
LEFT SHOULDER - 2+ VIEW

[shoulder grashey]
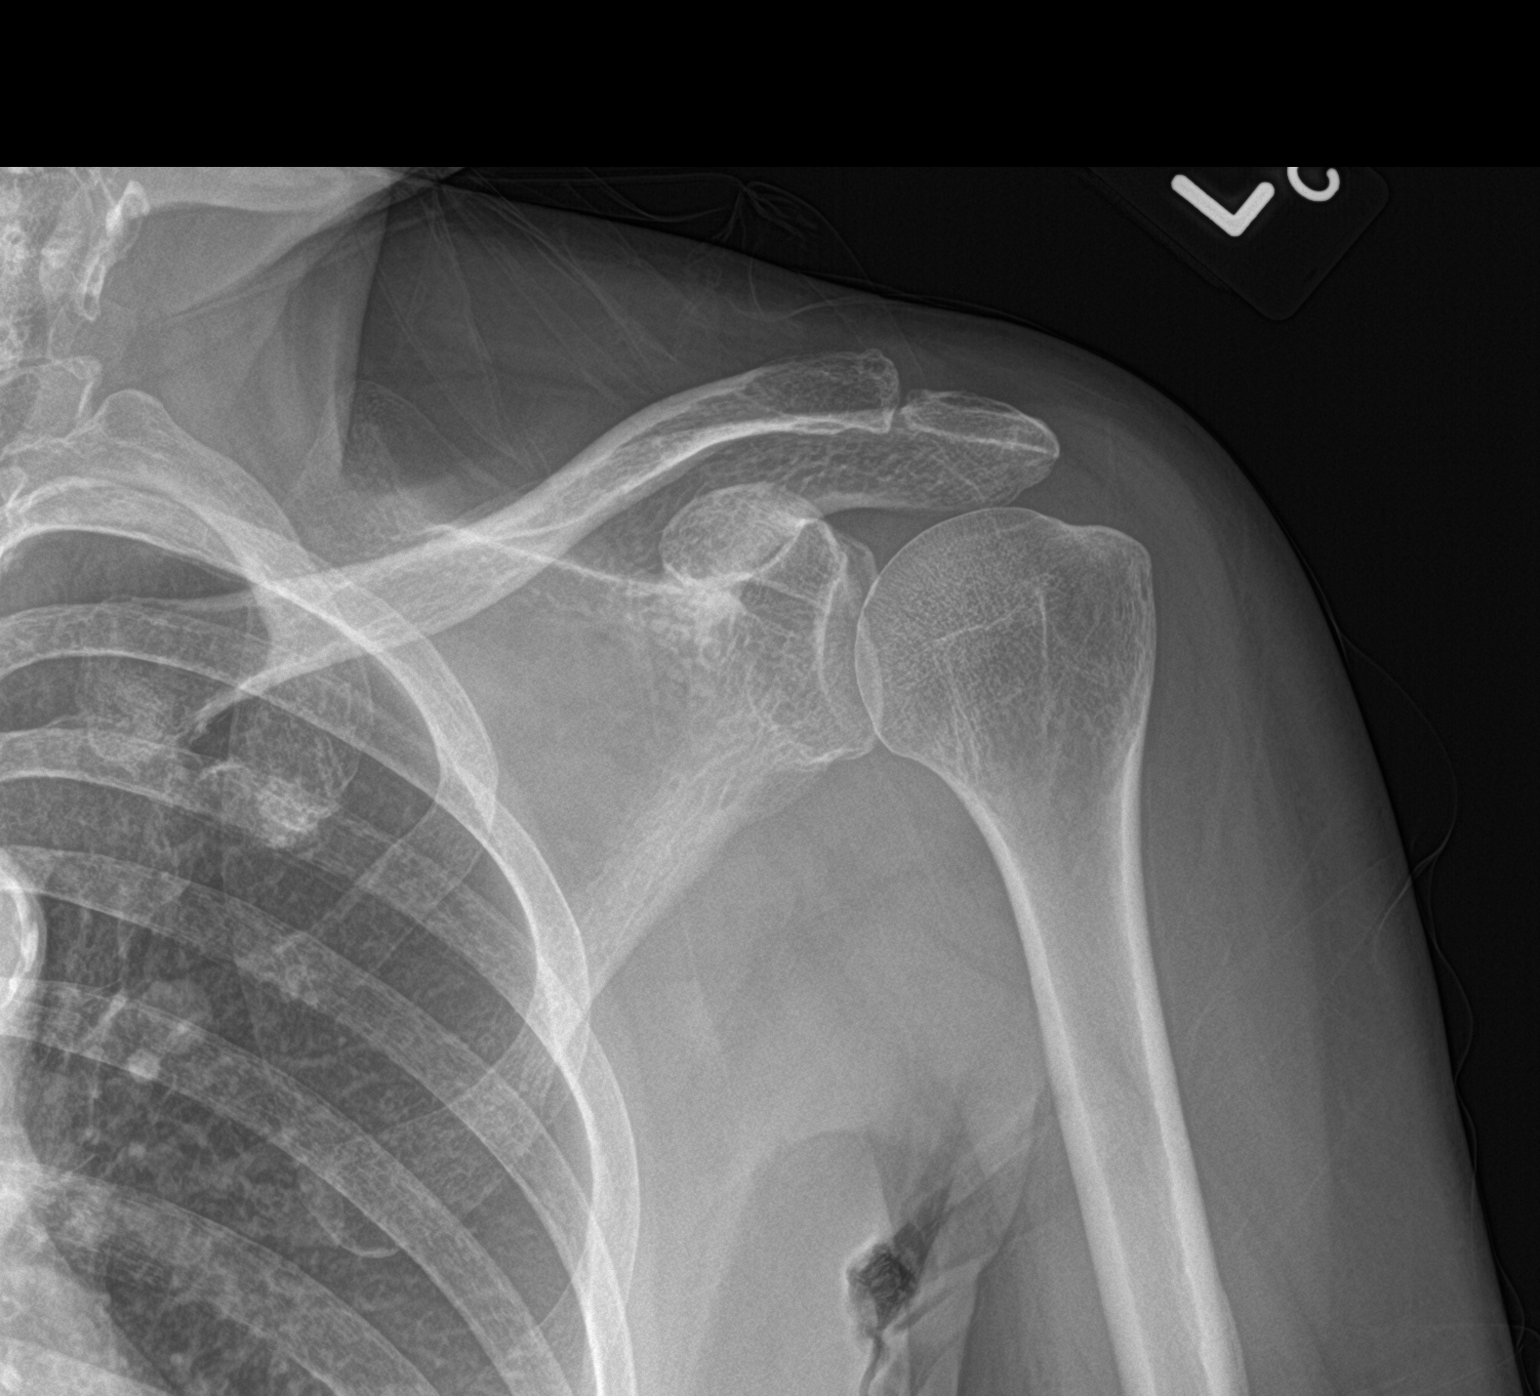

[shoulder y view]
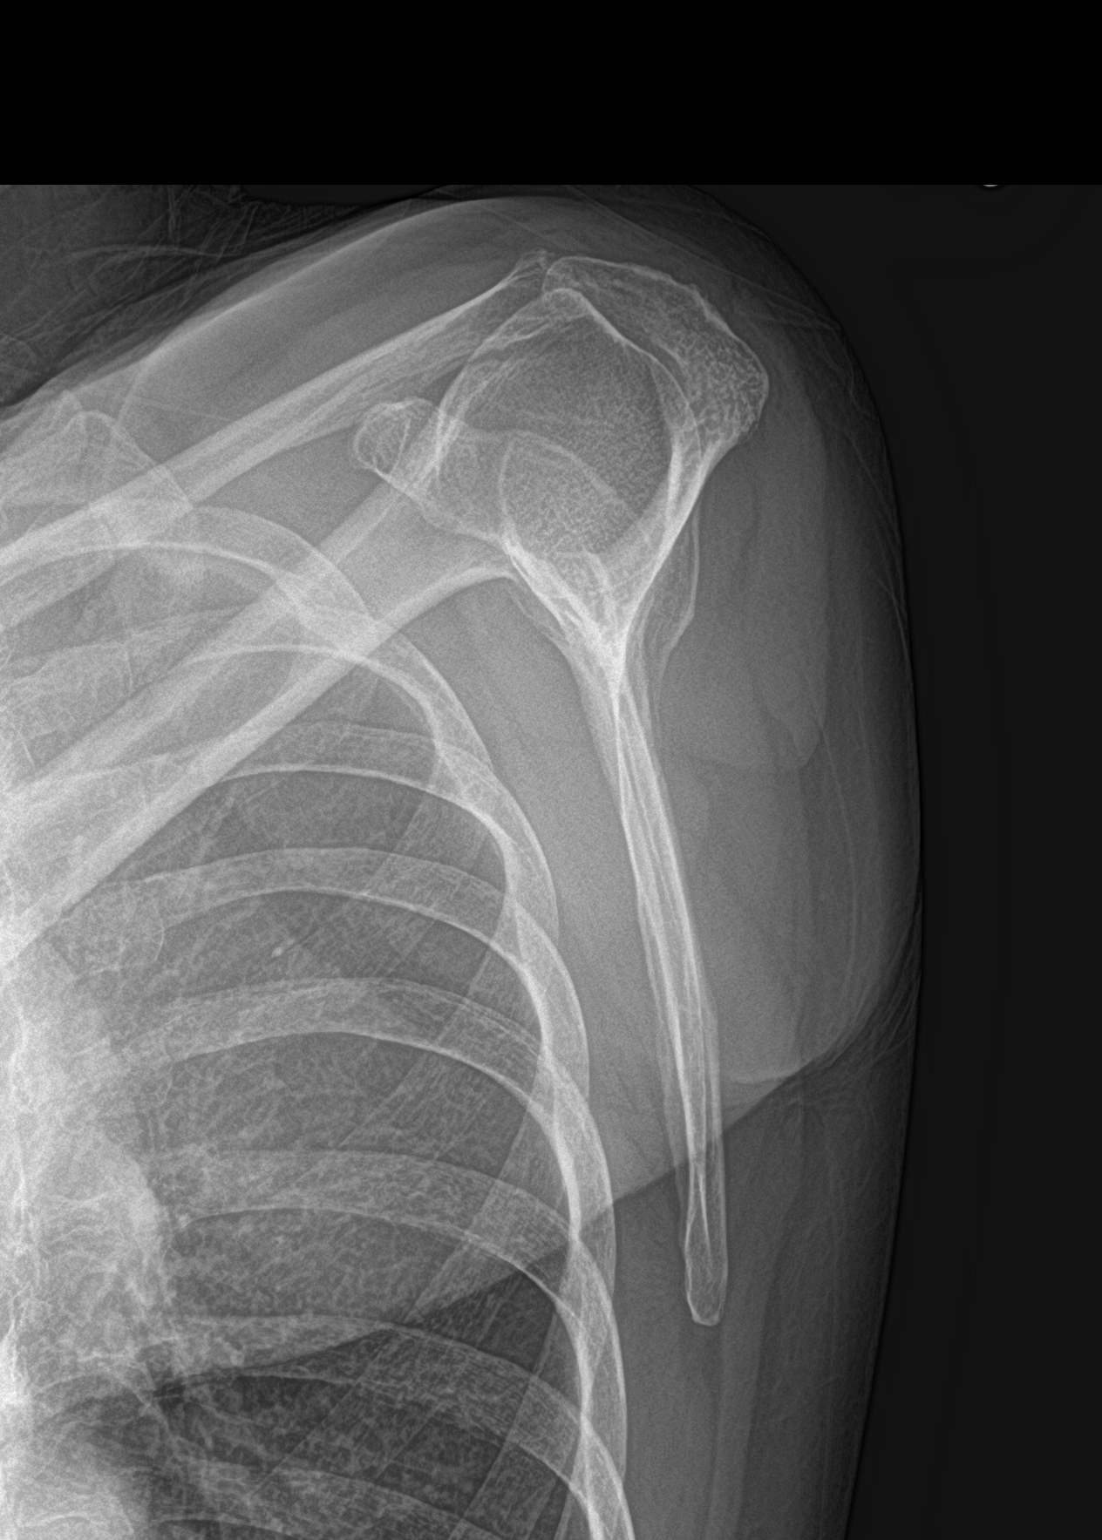

[2 of 2 positions shown; findings below may reference images not displayed]

FINDINGS: There is no evidence of fracture or dislocation. There is no
evidence of arthropathy or other focal bone abnormality. Soft
tissues are unremarkable.
IMPRESSION: Negative.

## 2020-08-03 IMAGING — DX LEFT ELBOW - 2 VIEW
2 series · 2 of 2 positions shown · non-contrast
Comparison: Pre reduction radiographs yesterday.

CLINICAL DATA: Dislocation, postreduction.

EXAM:
LEFT ELBOW - 2 VIEW

[elbow ap]
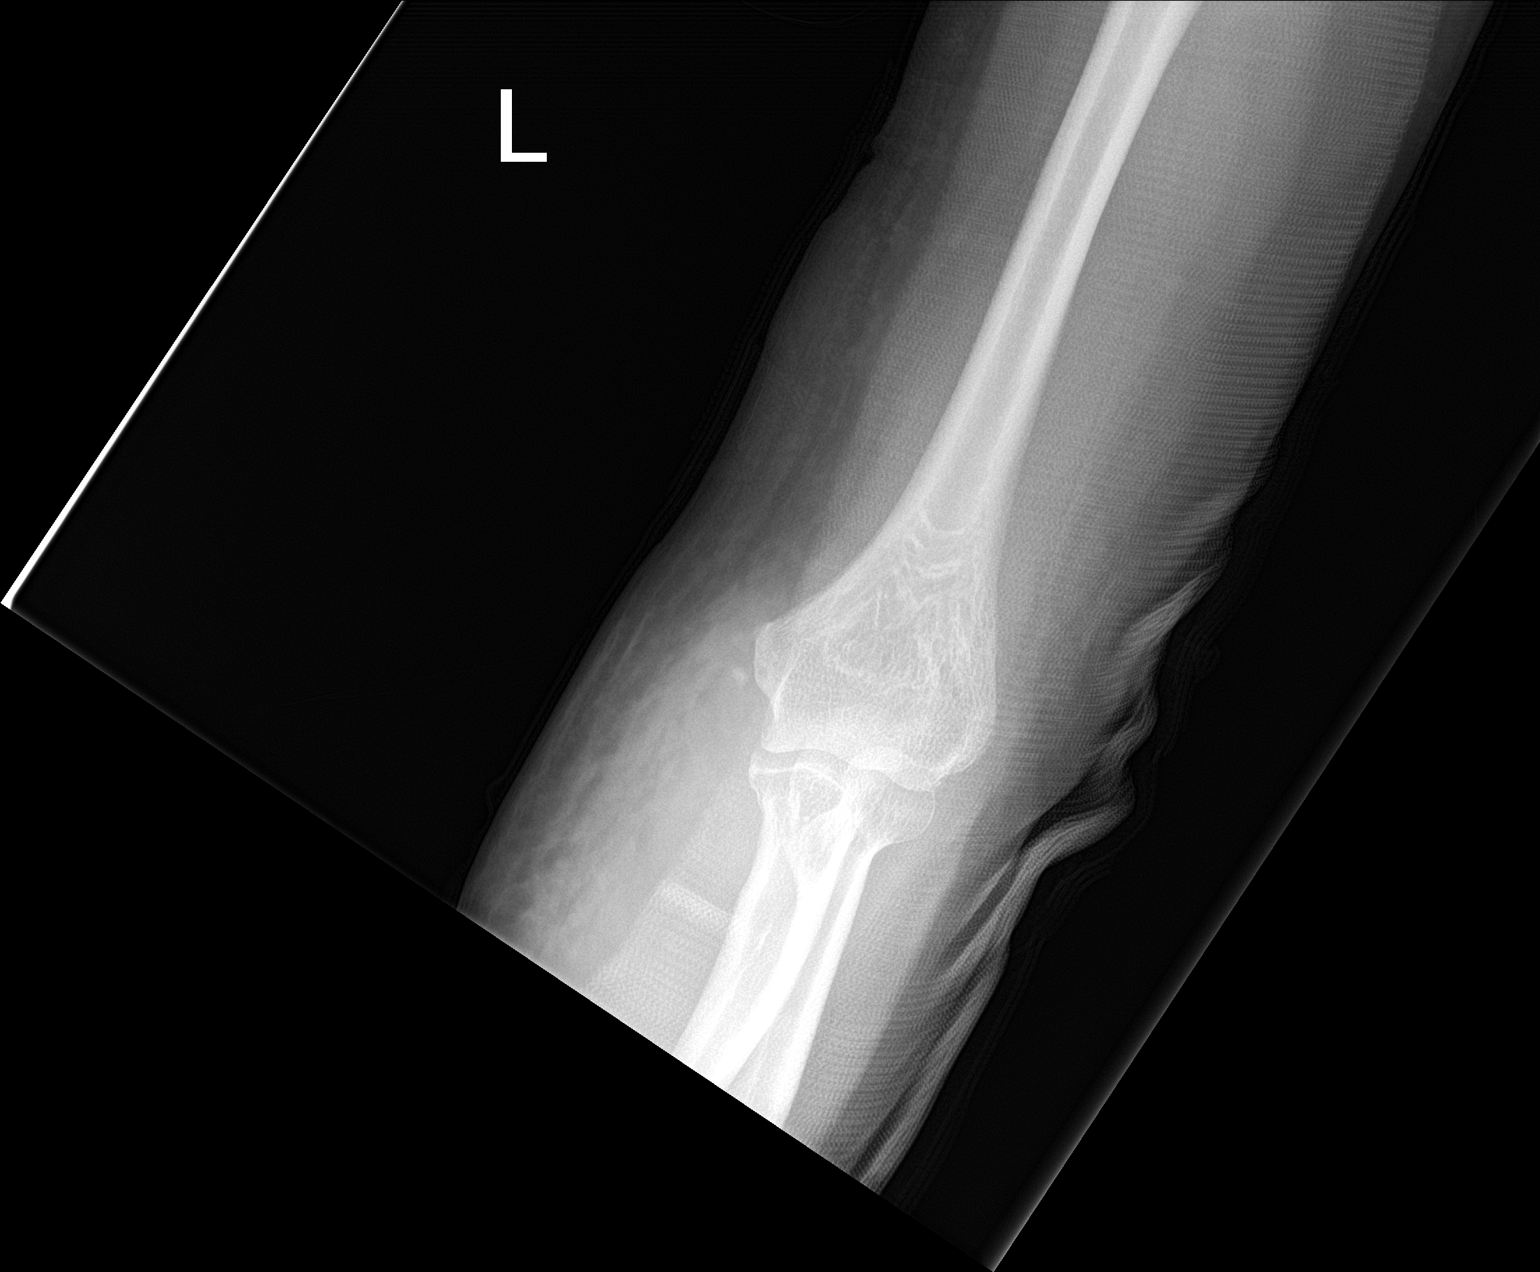

[elbow lat]
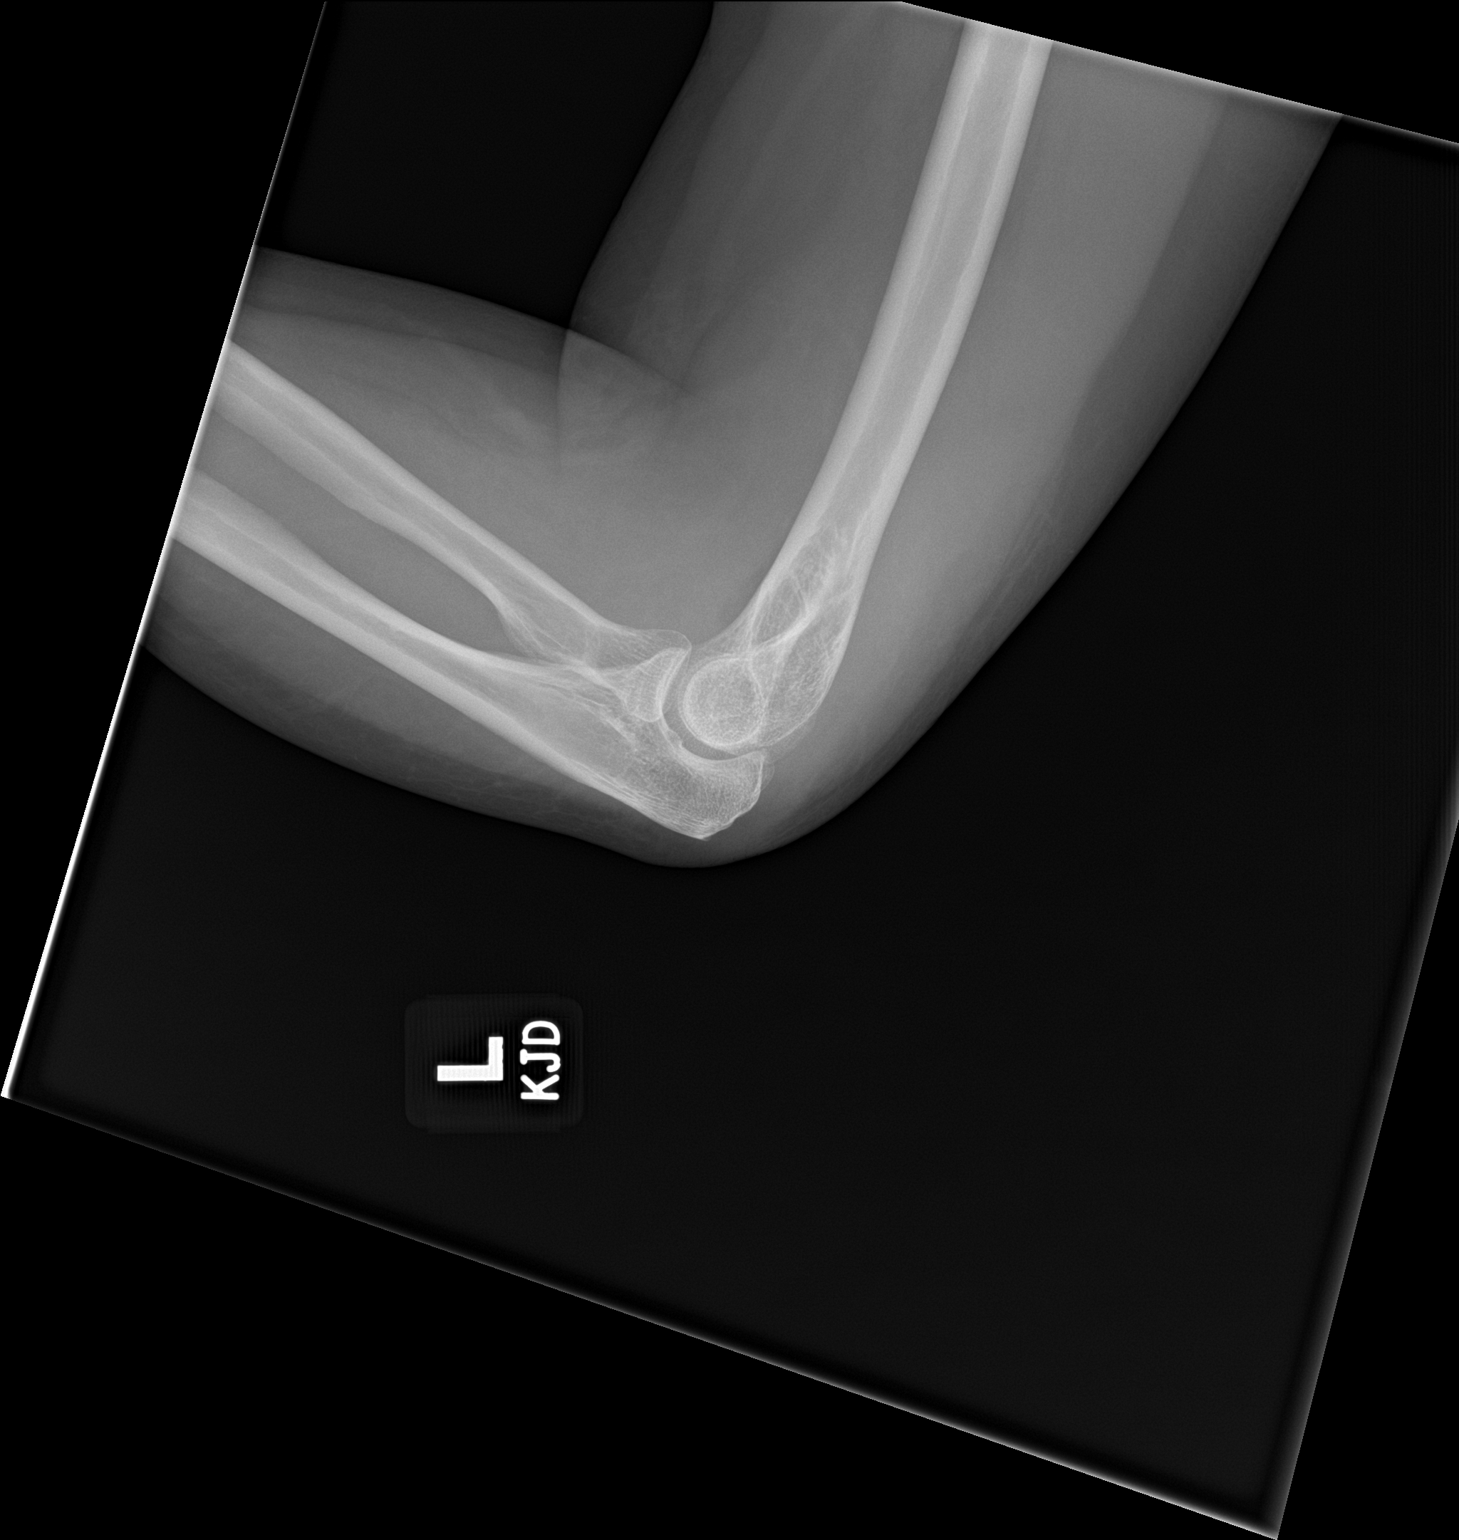

[2 of 2 positions shown; findings below may reference images not displayed]

FINDINGS: Previous elbow dislocation has been reduced. The alignment appears
anatomic. Small fracture fragment adjacent to the medial humeral
epicondyle, donor site uncertain. Small joint effusion. Diffuse soft
tissue edema.
IMPRESSION: Reduction of prior elbow dislocation. Small fracture fragment
adjacent to the medial humeral epicondyle, donor site uncertain.

## 2020-11-08 DIAGNOSIS — H43393 Other vitreous opacities, bilateral: Secondary | ICD-10-CM | POA: Diagnosis not present

## 2020-11-08 DIAGNOSIS — H40033 Anatomical narrow angle, bilateral: Secondary | ICD-10-CM | POA: Diagnosis not present

## 2021-05-22 DIAGNOSIS — Z23 Encounter for immunization: Secondary | ICD-10-CM | POA: Diagnosis not present

## 2021-05-22 DIAGNOSIS — G250 Essential tremor: Secondary | ICD-10-CM | POA: Diagnosis not present

## 2021-05-22 DIAGNOSIS — B181 Chronic viral hepatitis B without delta-agent: Secondary | ICD-10-CM | POA: Diagnosis not present

## 2021-05-22 DIAGNOSIS — F324 Major depressive disorder, single episode, in partial remission: Secondary | ICD-10-CM | POA: Diagnosis not present

## 2021-05-22 DIAGNOSIS — Z0000 Encounter for general adult medical examination without abnormal findings: Secondary | ICD-10-CM | POA: Diagnosis not present

## 2021-05-22 DIAGNOSIS — M85852 Other specified disorders of bone density and structure, left thigh: Secondary | ICD-10-CM | POA: Diagnosis not present

## 2021-05-22 DIAGNOSIS — R0789 Other chest pain: Secondary | ICD-10-CM | POA: Diagnosis not present

## 2021-05-22 DIAGNOSIS — E7800 Pure hypercholesterolemia, unspecified: Secondary | ICD-10-CM | POA: Diagnosis not present

## 2021-05-23 ENCOUNTER — Other Ambulatory Visit: Payer: Self-pay | Admitting: Family Medicine

## 2021-05-24 ENCOUNTER — Other Ambulatory Visit: Payer: Self-pay | Admitting: Family Medicine

## 2021-05-24 DIAGNOSIS — B181 Chronic viral hepatitis B without delta-agent: Secondary | ICD-10-CM

## 2021-06-07 ENCOUNTER — Ambulatory Visit
Admission: RE | Admit: 2021-06-07 | Discharge: 2021-06-07 | Disposition: A | Discharge status: Home / Self Care | Payer: Medicare PPO | Source: Ambulatory Visit | Attending: Family Medicine | Admitting: Family Medicine

## 2021-06-07 ENCOUNTER — Other Ambulatory Visit: Payer: Self-pay

## 2021-06-07 DIAGNOSIS — B181 Chronic viral hepatitis B without delta-agent: Secondary | ICD-10-CM

## 2021-06-07 DIAGNOSIS — B1910 Unspecified viral hepatitis B without hepatic coma: Secondary | ICD-10-CM | POA: Diagnosis not present

## 2021-06-20 DIAGNOSIS — Z780 Asymptomatic menopausal state: Secondary | ICD-10-CM | POA: Diagnosis not present

## 2021-06-20 DIAGNOSIS — Z1231 Encounter for screening mammogram for malignant neoplasm of breast: Secondary | ICD-10-CM | POA: Diagnosis not present

## 2021-06-20 DIAGNOSIS — M85852 Other specified disorders of bone density and structure, left thigh: Secondary | ICD-10-CM | POA: Diagnosis not present

## 2021-06-20 DIAGNOSIS — M85851 Other specified disorders of bone density and structure, right thigh: Secondary | ICD-10-CM | POA: Diagnosis not present

## 2021-07-04 DIAGNOSIS — R922 Inconclusive mammogram: Secondary | ICD-10-CM | POA: Diagnosis not present

## 2021-07-04 DIAGNOSIS — R928 Other abnormal and inconclusive findings on diagnostic imaging of breast: Secondary | ICD-10-CM | POA: Diagnosis not present

## 2021-07-04 DIAGNOSIS — N6489 Other specified disorders of breast: Secondary | ICD-10-CM | POA: Diagnosis not present

## 2021-11-14 DIAGNOSIS — H04123 Dry eye syndrome of bilateral lacrimal glands: Secondary | ICD-10-CM | POA: Diagnosis not present

## 2022-01-02 DIAGNOSIS — R922 Inconclusive mammogram: Secondary | ICD-10-CM | POA: Diagnosis not present

## 2022-01-02 DIAGNOSIS — R928 Other abnormal and inconclusive findings on diagnostic imaging of breast: Secondary | ICD-10-CM | POA: Diagnosis not present

## 2022-05-15 ENCOUNTER — Other Ambulatory Visit: Payer: Self-pay | Admitting: Family Medicine

## 2022-05-15 DIAGNOSIS — B181 Chronic viral hepatitis B without delta-agent: Secondary | ICD-10-CM

## 2022-05-28 ENCOUNTER — Other Ambulatory Visit: Payer: Medicare PPO

## 2022-05-29 DIAGNOSIS — H16143 Punctate keratitis, bilateral: Secondary | ICD-10-CM | POA: Diagnosis not present

## 2022-05-31 DIAGNOSIS — B181 Chronic viral hepatitis B without delta-agent: Secondary | ICD-10-CM | POA: Diagnosis not present

## 2022-05-31 DIAGNOSIS — Z0000 Encounter for general adult medical examination without abnormal findings: Secondary | ICD-10-CM | POA: Diagnosis not present

## 2022-05-31 DIAGNOSIS — E7800 Pure hypercholesterolemia, unspecified: Secondary | ICD-10-CM | POA: Diagnosis not present

## 2022-05-31 DIAGNOSIS — Z23 Encounter for immunization: Secondary | ICD-10-CM | POA: Diagnosis not present

## 2022-05-31 DIAGNOSIS — F324 Major depressive disorder, single episode, in partial remission: Secondary | ICD-10-CM | POA: Diagnosis not present

## 2022-06-03 ENCOUNTER — Ambulatory Visit
Admission: RE | Admit: 2022-06-03 | Discharge: 2022-06-03 | Disposition: A | Discharge status: Home / Self Care | Payer: Medicare PPO | Source: Ambulatory Visit | Attending: Family Medicine | Admitting: Family Medicine

## 2022-06-03 DIAGNOSIS — B181 Chronic viral hepatitis B without delta-agent: Secondary | ICD-10-CM

## 2022-06-03 DIAGNOSIS — B1910 Unspecified viral hepatitis B without hepatic coma: Secondary | ICD-10-CM | POA: Diagnosis not present

## 2022-06-21 DIAGNOSIS — Z1231 Encounter for screening mammogram for malignant neoplasm of breast: Secondary | ICD-10-CM | POA: Diagnosis not present

## 2023-03-25 DIAGNOSIS — H10013 Acute follicular conjunctivitis, bilateral: Secondary | ICD-10-CM | POA: Diagnosis not present

## 2023-04-01 DIAGNOSIS — H10023 Other mucopurulent conjunctivitis, bilateral: Secondary | ICD-10-CM | POA: Diagnosis not present

## 2023-05-30 DIAGNOSIS — H16143 Punctate keratitis, bilateral: Secondary | ICD-10-CM | POA: Diagnosis not present

## 2023-06-03 DIAGNOSIS — B181 Chronic viral hepatitis B without delta-agent: Secondary | ICD-10-CM | POA: Diagnosis not present

## 2023-06-03 DIAGNOSIS — E7800 Pure hypercholesterolemia, unspecified: Secondary | ICD-10-CM | POA: Diagnosis not present

## 2023-06-06 DIAGNOSIS — Z0000 Encounter for general adult medical examination without abnormal findings: Secondary | ICD-10-CM | POA: Diagnosis not present

## 2023-06-06 DIAGNOSIS — F324 Major depressive disorder, single episode, in partial remission: Secondary | ICD-10-CM | POA: Diagnosis not present

## 2023-06-06 DIAGNOSIS — Z9181 History of falling: Secondary | ICD-10-CM | POA: Diagnosis not present

## 2023-06-06 DIAGNOSIS — E7800 Pure hypercholesterolemia, unspecified: Secondary | ICD-10-CM | POA: Diagnosis not present

## 2023-06-06 DIAGNOSIS — M85852 Other specified disorders of bone density and structure, left thigh: Secondary | ICD-10-CM | POA: Diagnosis not present

## 2023-06-06 DIAGNOSIS — B181 Chronic viral hepatitis B without delta-agent: Secondary | ICD-10-CM | POA: Diagnosis not present

## 2023-06-09 ENCOUNTER — Other Ambulatory Visit: Payer: Self-pay | Admitting: Family Medicine

## 2023-06-09 DIAGNOSIS — B181 Chronic viral hepatitis B without delta-agent: Secondary | ICD-10-CM

## 2023-06-10 ENCOUNTER — Other Ambulatory Visit: Payer: Medicare PPO

## 2023-06-16 ENCOUNTER — Ambulatory Visit
Admission: RE | Admit: 2023-06-16 | Discharge: 2023-06-16 | Disposition: A | Discharge status: Home / Self Care | Payer: Medicare PPO | Source: Ambulatory Visit | Attending: Family Medicine | Admitting: Family Medicine

## 2023-06-16 DIAGNOSIS — K769 Liver disease, unspecified: Secondary | ICD-10-CM | POA: Diagnosis not present

## 2023-06-16 DIAGNOSIS — B181 Chronic viral hepatitis B without delta-agent: Secondary | ICD-10-CM

## 2023-08-05 DIAGNOSIS — Z1231 Encounter for screening mammogram for malignant neoplasm of breast: Secondary | ICD-10-CM | POA: Diagnosis not present

## 2023-08-05 DIAGNOSIS — N958 Other specified menopausal and perimenopausal disorders: Secondary | ICD-10-CM | POA: Diagnosis not present

## 2023-08-05 DIAGNOSIS — M8588 Other specified disorders of bone density and structure, other site: Secondary | ICD-10-CM | POA: Diagnosis not present

## 2023-09-23 DIAGNOSIS — H1013 Acute atopic conjunctivitis, bilateral: Secondary | ICD-10-CM | POA: Diagnosis not present

## 2023-10-02 ENCOUNTER — Other Ambulatory Visit (HOSPITAL_COMMUNITY): Payer: Self-pay

## 2024-06-09 ENCOUNTER — Other Ambulatory Visit: Payer: Self-pay | Admitting: Family Medicine

## 2024-06-09 DIAGNOSIS — B181 Chronic viral hepatitis B without delta-agent: Secondary | ICD-10-CM

## 2024-06-28 ENCOUNTER — Ambulatory Visit
Admission: RE | Admit: 2024-06-28 | Discharge: 2024-06-28 | Disposition: A | Discharge status: Home / Self Care | Source: Ambulatory Visit | Attending: Family Medicine | Admitting: Family Medicine

## 2024-06-28 DIAGNOSIS — B181 Chronic viral hepatitis B without delta-agent: Secondary | ICD-10-CM
